# Patient Record
Sex: Female | Born: 1975
Health system: Southern US, Community
[De-identification: ages and names within clinical notes are randomized; demographics above are authoritative.]

## PROBLEM LIST (undated history)

## (undated) DIAGNOSIS — M1990 Unspecified osteoarthritis, unspecified site: Secondary | ICD-10-CM

## (undated) DIAGNOSIS — J45909 Unspecified asthma, uncomplicated: Secondary | ICD-10-CM

## (undated) HISTORY — PX: TONSILLECTOMY: SUR1361

## (undated) HISTORY — PX: WISDOM TOOTH EXTRACTION: SHX21

---

## 1998-03-04 ENCOUNTER — Inpatient Hospital Stay (HOSPITAL_COMMUNITY): Admission: AD | Admit: 1998-03-04 | Discharge: 1998-03-04 | Payer: Self-pay | Admitting: *Deleted

## 1998-06-23 ENCOUNTER — Ambulatory Visit (HOSPITAL_COMMUNITY): Admission: RE | Admit: 1998-06-23 | Discharge: 1998-06-23 | Payer: Self-pay | Admitting: *Deleted

## 1998-09-21 ENCOUNTER — Inpatient Hospital Stay (HOSPITAL_COMMUNITY): Admission: AD | Admit: 1998-09-21 | Discharge: 1998-09-21 | Payer: Self-pay | Admitting: *Deleted

## 1998-09-24 ENCOUNTER — Inpatient Hospital Stay (HOSPITAL_COMMUNITY): Admission: AD | Admit: 1998-09-24 | Discharge: 1998-09-24 | Payer: Self-pay | Admitting: Obstetrics & Gynecology

## 1998-10-24 ENCOUNTER — Inpatient Hospital Stay (HOSPITAL_COMMUNITY): Admission: AD | Admit: 1998-10-24 | Discharge: 1998-10-26 | Payer: Self-pay | Admitting: Obstetrics and Gynecology

## 2002-03-26 ENCOUNTER — Other Ambulatory Visit: Admission: RE | Admit: 2002-03-26 | Discharge: 2002-03-26 | Payer: Self-pay | Admitting: Obstetrics and Gynecology

## 2002-04-22 ENCOUNTER — Ambulatory Visit (HOSPITAL_COMMUNITY): Admission: RE | Admit: 2002-04-22 | Discharge: 2002-04-22 | Payer: Self-pay | Admitting: Obstetrics and Gynecology

## 2003-02-04 ENCOUNTER — Encounter: Payer: Self-pay | Admitting: Emergency Medicine

## 2003-02-04 ENCOUNTER — Emergency Department (HOSPITAL_COMMUNITY): Admission: EM | Admit: 2003-02-04 | Discharge: 2003-02-04 | Payer: Self-pay | Admitting: Emergency Medicine

## 2003-05-21 ENCOUNTER — Other Ambulatory Visit: Admission: RE | Admit: 2003-05-21 | Discharge: 2003-05-21 | Payer: Self-pay | Admitting: Obstetrics and Gynecology

## 2003-11-24 ENCOUNTER — Emergency Department (HOSPITAL_COMMUNITY): Admission: EM | Admit: 2003-11-24 | Discharge: 2003-11-24 | Payer: Self-pay | Admitting: Emergency Medicine

## 2004-03-21 ENCOUNTER — Emergency Department (HOSPITAL_COMMUNITY): Admission: EM | Admit: 2004-03-21 | Discharge: 2004-03-21 | Payer: Self-pay | Admitting: Emergency Medicine

## 2004-11-23 ENCOUNTER — Inpatient Hospital Stay (HOSPITAL_COMMUNITY): Admission: AD | Admit: 2004-11-23 | Discharge: 2004-11-23 | Payer: Self-pay | Admitting: Obstetrics and Gynecology

## 2005-01-25 ENCOUNTER — Emergency Department (HOSPITAL_COMMUNITY): Admission: EM | Admit: 2005-01-25 | Discharge: 2005-01-25 | Payer: Self-pay | Admitting: Emergency Medicine

## 2005-03-24 ENCOUNTER — Emergency Department (HOSPITAL_COMMUNITY): Admission: EM | Admit: 2005-03-24 | Discharge: 2005-03-24 | Payer: Self-pay | Admitting: Emergency Medicine

## 2006-07-26 ENCOUNTER — Other Ambulatory Visit: Admission: RE | Admit: 2006-07-26 | Discharge: 2006-07-26 | Payer: Self-pay | Admitting: Obstetrics and Gynecology

## 2007-08-14 ENCOUNTER — Ambulatory Visit (HOSPITAL_COMMUNITY): Admission: RE | Admit: 2007-08-14 | Discharge: 2007-08-14 | Payer: Self-pay | Admitting: Obstetrics and Gynecology

## 2011-05-08 NOTE — Op Note (Signed)
[name], Carolyn Martinez              ACCOUNT NO.:  [id number]   MEDICAL RECORD NO.:  [id number]          PATIENT TYPE:  AMB   LOCATION:  SDC                           FACILITY:  [facility]   PHYSICIAN:  Sherron Monday, MD        DATE OF BIRTH:  July 04, 1976   DATE OF PROCEDURE:  08/14/2007  DATE OF DISCHARGE:                               OPERATIVE REPORT   PREOPERATIVE DIAGNOSIS:  Pelvic pain, dysmenorrhea.   POSTOPERATIVE DIAGNOSIS:  Pelvic pain, dysmenorrhea.   OPERATION PERFORMED:  Operative laparoscopy, lysis of adhesions.   SURGEON:  Sherron Monday, MD   ASSISTANT:  None.   ANESTHESIA:  General, with 6 mL of local anesthesia.   FINDINGS:  Normal uterus, tubes and bilateral ovaries.  Normal liver  edge.  Appendix not visualized.  Small area on the left side of  peritoneal adhesion.   SPECIMEN:  None.   ESTIMATED BLOOD LOSS:  Minimal.   IV FLUIDS:  1700 mL.   URINE OUTPUT:  In and out catheter at start of procedure.   COMPLICATIONS:  None.   DISPOSITION:  Stable to PACU.   DESCRIPTION OF PROCEDURE:  After informed consent was reviewed with the  patient including risks, benefits and alternatives of surgical  procedure, including bleeding, infection, damage to the surrounding  organs, including but not limited to bowel, bladder, ureter, blood  vessels in the pelvis.  She stated that she had previously had surgery  with lysis of adhesions with similar symptoms and felt much better  following that.  Decision was made to proceed with the surgery.  The  patient was taken to the operating room, placed on the table in supine  position.  General anesthesia was induced and found to be adequate.  She  was then placed in the yellow fin stirrups, prepped and draped in the  normal sterile fashion.  The bladder was steriley drained.  Using a  speculum, a uterine manipulator was placed.  Local anesthesia was placed  at the site of the incision. An approximately 1 cm infraumbilical  incision was  made.  Using Veress needle, the peritoneal cavity was  identified as being entered by the Veress with the hanging drop test.  Pneumoperitoneum was then obtained.  Using direct visualization, a 5 mm  trocar was placed through the umbilicus and the 5 mm camera was used to  visualize the pelvis.  The above findings were noted with a small area  on the left side of filmy thin adhesion between peritoneum and colon.  An accessory port was placed on the left after visualization of the  epigastric vessels.  Prior to the port being placed the area was infused  with local anesthesia. Approximately 1 cm incision was made and a 5 mm  port was placed swith direct visualization.  Using blunt probe, the  normal anatomy was again verified and using incision and Kleppingers to  obtain hemostasis the adhesions were taken down.  The suction irrigator  was used and hemostasis was again assured.  The accessory port was  removed under direct visualization and pneumoperitoneum was evacuated  from her abdomen.  The ports were all removed.  The skin incisions were  closed with 4-0 Vicryl in typical fashion.  Sponge, lap, and needle  counts were correct x 2.  The patient tolerated the procedure well.  The  uterine manipulator was removed.      Sherron Monday, MD  Electronically Signed     JB/MEDQ  D:  08/14/2007  T:  08/15/2007  Job:  [id number]

## 2011-05-08 NOTE — H&P (Signed)
Carolyn Martinez, Carolyn Martinez              ACCOUNT NO.:  [id number]   MEDICAL RECORD NO.:  [id number]          PATIENT TYPE:  AMB   LOCATION:  SDC                           FACILITY:  [facility]   PHYSICIAN:  Sherron Monday, MD        DATE OF BIRTH:  [birth year]   DATE OF ADMISSION:  DATE OF DISCHARGE:                              HISTORY & PHYSICAL   She is being admitted to undergo a diagnostic/possible operative  laparoscope with a diagnosis of pelvic pain.   A 35 year old G2, P1-0-1-1 with continuous pain and cramping throughout  her cycles.  She states that her pain very much increases during her  cycle.  She has had a trial of pills and did not feel that this helped  her so she has stopped them.   PAST MEDICAL HISTORY:  Significant for asthma without hospitalizations.   PAST SURGICAL HISTORY:  Significant for laparoscopy in April 2003 at  which time she definitely knows she had lysis of adhesions and is unsure  about if she had a lipoma removed.   PAST OBSTETRICAL AND GYNECOLOGICAL HISTORY:  She is a G2, P1-0-1-1 with  G1 being a term delivery of a 6-pound 9-ounce infant.  G2 was an  elective abortion.  No abnormal Pap smears or history of sexually  transmitted disease.  She describes her menses as monthly and lasting 7  days.  She uses condoms for birth control.  She has dyspareunia but is  helped by position change.  She is sexually active with just one  partner.  No history of any intermenstrual bleeding or any postcoital  bleeding.   MEDICATIONS:  Albuterol p.r.n.   ALLERGIES:  No known drug allergies.   SOCIAL HISTORY:  Admits to smoking about a half pack a day.  No alcohol  or drug use.  In a serious long-term relationship and works in [employer].   FAMILY HISTORY:  Denies diabetes.  Hypertension in her father.  Endometriosis in her mother and sister.  Coronary artery disease in  maternal grandfather and a paternal aunt.  No breast, colon or ovarian  cancer.   PHYSICAL  EXAMINATION:  Afebrile, vital signs are stable with a weight of  226 pounds.  GENERAL:  No apparent distress.  CARDIOVASCULAR:  Regular rate and rhythm.  LUNGS:  Clear to auscultation bilaterally.  ABDOMEN:  Soft, obese, nontender, nondistended, good bowel sounds.  EXTREMITIES:  Symmetric and nontender.  NECK:  Without lymphadenopathy.  HEENT:  Mucous membranes are moist.  Thyroid within normal limits.  BREASTS:  D cup, no masses, nontender, no to soreness in back and no  costovertebral angle tenderness.  PELVIC:  Normal external female genitalia.  Normal Bartholin's, urethra  and Skene's.  Good support.  Cervix and vagina without lesions.  No  cervical motion tenderness.  Difficult to palpate secondary to her  habitus but apparently normal uterus and adnexa with no masses and  nontender.   On that exam day she had a Pap smear, gonorrhea and chlamydia cultures.  The Pap was within normal limits and the gonorrhea and chlamydia were  negative.  She also had a STD screen that showed that she had been  exposed to HSV type 1.  We recommended weight loss.  She also discussed  with [place] she wants a definitive answer.  We discussed with her risks,  benefits and alternatives of a laparoscopic evaluation of her pelvis  including bleeding, infection, damage to the surrounding organs  including but not limited to bowel, bladder, ureters and blood vessels.  She voices understanding to all this and we will proceed with the  diagnostic, possible operative laparoscope, lysis of adhesions on August 14, 2007.      Sherron Monday, MD  Electronically Signed     JB/MEDQ  D:  08/13/2007  T:  08/14/2007  Job:  [id number]

## 2011-05-11 NOTE — H&P (Signed)
[facility]  Patient:    Carolyn Martinez, Carolyn Martinez Visit Number: [id number] MRN: [id number]          Service Type: Attending:  Alvino Chapel, M.D. Dictated by:   Alvino Chapel, M.D.                           History and Physical  (For surgery on April 22, 2002, at 7:30 a.m.)  HISTORY OF PRESENT ILLNESS:  Patient is a 35 year old, G2, P1-0-1-1, who is admitted for a diagnostic laparoscopy given a problem with recurrent pelvic pain unamenable to Depo-Provera or oral contraceptives.  Patient began to have problems with this pain approximately six to seven months ago and had been treated with a variety of birth control pills as well as Depo-Provera in hopes to alleviate her pain. Although the patient does experience pain daily, she has also noted that the pain is significantly worse with her menses and has required narcotic pain relief for several cycles.  Medically, the patient has no other issues.  PAST SURGICAL HISTORY:  Significant only for tonsillectomy.  PAST OBSTETRICAL HISTORY:  She had a normal spontaneous vaginal delivery, vacuum assisted, and one miscarriage.  PAST GYNECOLOGICAL HISTORY:  There are no abnormal Pap smears, the most recent one in April being normal.  CURRENT MEDICATIONS:  Depo-Provera only.  SOCIAL HISTORY:  Patient is a smoker of approximately half a pack a day.  PHYSICAL EXAMINATION:  VITAL SIGNS:  Patients weight is 221 pounds, blood pressure 120/80, hemoglobin 14.9.  BREASTS:  There are no masses, discharge, or adenopathy noted.  CARDIAC:  Regular rate and rhythm.  LUNGS:  Clear.  ABDOMEN:  Soft and nontender.  PELVIC:  Normal external genitalia.  Cervix has no lesions.  The uterus is normal in size.  There is some nodularity of the uterosacral ligaments palpable.  The adnexa have no palpable masses.  PLAN:  Prior to the procedure, the patient was counseled as to the risks and benefits of proceeding  including bleeding, infection, and possible damage to bowel and bladder with a laparoscopic approach.  The patient understands these risks and also understands the risks to possibly need to proceed with a larger incision should it prove difficult to establish pneumoperitoneum.  Patient is also aware that there could be no pathologies notable; however, we will carefully examine her for any evidence of endometriosis or other pathology. Dictated by:   Alvino Chapel, M.D. Attending:  Alvino Chapel, M.D. DD:  04/20/02 TD:  04/20/02 Job: [phone] [id number]

## 2011-05-11 NOTE — Op Note (Signed)
[facility]  Patient:    Carolyn Martinez, Carolyn Martinez Visit Number: [id number] MRN: [id number]          Service Type: DSU Location: DAY Attending Physician:  Oliver Pila Dictated by:   Huel Cote, M.D. Proc. Date: 04/22/02 Admit Date:  04/22/2002                             Operative Report  PREOPERATIVE DIAGNOSIS:  Pelvic pain.  POSTOPERATIVE DIAGNOSES: 1. Pelvic pain. 2. Minimal adhesions of large bowel to the right sidewall.  PROCEDURE: 1. Diagnostic laparoscopy. 2. Lysis of adhesions.  SURGEON:  Huel Cote, M.D.  ANESTHESIA:  General.  ESTIMATED BLOOD LOSS:  Minimal.  IV FLUIDS:  1200 cc L.R.; urine output 50 cc straight catheterization prior to procedure.  FINDINGS:  There was normal uterus, tubes, and ovaries noted.  There was no evidence of endometriosis, and a small amount of colonic adhesions were noted to the right sidewall and taken down.  DESCRIPTION OF PROCEDURE:  The patient was taken to the operating room where general anesthesia was obtained without difficulty.  She was then prepped and draped in the normal sterile fashion in the dorsal lithotomy position.  A speculum was placed within the vagina and the cervix grasped with a single-tooth tenaculum and the Hulka tenaculum introduced into the cervix. The single-tooth tenaculum and the speculum were then removed from the vagina, and attention was turned to the patients abdomen.  A small infraumbilical incision was made with the scalpel and after 0.25% Marcaine was injected, the Veress needle was then introduced into the incision and intraperitoneal placement confirmed by both injection and aspiration with normal saline.  The camera was introduced and pelvic findings noted.  There were some adhesions of the cecum to the right pelvic sidewall which is where the patient had been experiencing pain.  Therefore, a second 5 mm trocar was introduced into the lower left  quadrant under direct visualization after injection with 0.25% Marcaine.  From this trocar, the adhesion was taken down carefully with both sharp dissection and blunt dissection.  At the conclusion of the procedure, there were no further adhesions noted.  Therefore, the abdomen was inspected and the liver found to be normal.  No other pathology was noted.  The cul-de-sac was carefully inspected and no endometriosis noted.  The ovaries appeared free as did the tubes.  The uterus was slightly globular in nature which could be consistent with adenomyosis.  The 5 mm trocar was then moved under direct visualization with no active bleeding noted.  The pneumoperitoneum reduced through the 10-11 trocar which was placed in the umbilicus and the 10-11 removed under direct visualization.  The umbilical incision was then closed with one deep figure-of-eight suture of 0 Vicryl, and the skin was closed with 3-0 Vicryl in a subcuticular stitch.  The 5 mm trocar site was closed in one mattress suture of 3-0 Vicryl. Dictated by:   Huel Cote, M.D. Attending Physician:  Oliver Pila DD:  04/22/02 TD:  04/22/02 Job: [id number]

## 2011-10-05 LAB — CBC
Hemoglobin: 14.4
MCHC: 34
RDW: 13.3

## 2011-10-05 LAB — HCG, SERUM, QUALITATIVE: Preg, Serum: NEGATIVE

## 2011-11-21 ENCOUNTER — Other Ambulatory Visit: Payer: Self-pay | Admitting: Internal Medicine

## 2011-11-21 DIAGNOSIS — N63 Unspecified lump in unspecified breast: Secondary | ICD-10-CM

## 2011-12-04 ENCOUNTER — Other Ambulatory Visit: Payer: Self-pay

## 2011-12-07 ENCOUNTER — Ambulatory Visit
Admission: RE | Admit: 2011-12-07 | Discharge: 2011-12-07 | Disposition: A | Discharge status: Home / Self Care | Payer: Medicaid Other | Source: Ambulatory Visit | Attending: Internal Medicine | Admitting: Internal Medicine

## 2011-12-07 DIAGNOSIS — N63 Unspecified lump in unspecified breast: Secondary | ICD-10-CM

## 2013-01-01 ENCOUNTER — Ambulatory Visit: Payer: Self-pay | Admitting: Family Medicine

## 2013-01-01 VITALS — BP 138/98 | HR 77 | Temp 98.3°F | Resp 16 | Ht 62.0 in | Wt 242.2 lb

## 2013-01-01 DIAGNOSIS — R300 Dysuria: Secondary | ICD-10-CM

## 2013-01-01 DIAGNOSIS — Z202 Contact with and (suspected) exposure to infections with a predominantly sexual mode of transmission: Secondary | ICD-10-CM

## 2013-01-01 DIAGNOSIS — Z2089 Contact with and (suspected) exposure to other communicable diseases: Secondary | ICD-10-CM

## 2013-01-01 LAB — POCT URINALYSIS DIPSTICK
Bilirubin, UA: NEGATIVE
Glucose, UA: NEGATIVE
Ketones, UA: NEGATIVE
Leukocytes, UA: NEGATIVE
Nitrite, UA: NEGATIVE
pH, UA: 7.5

## 2013-01-01 LAB — POCT WET PREP WITH KOH
Clue Cells Wet Prep HPF POC: 100
KOH Prep POC: POSITIVE
Trichomonas, UA: NEGATIVE

## 2013-01-01 LAB — POCT UA - MICROSCOPIC ONLY
Bacteria, U Microscopic: NEGATIVE
Casts, Ur, LPF, POC: NEGATIVE
Mucus, UA: NEGATIVE

## 2013-01-01 MED ORDER — FLUCONAZOLE 150 MG PO TABS: 150.0000 mg | ORAL_TABLET | Freq: Once | ORAL | Status: DC

## 2013-01-01 MED ORDER — METRONIDAZOLE 500 MG PO TABS: 500.0000 mg | ORAL_TABLET | Freq: Two times a day (BID) | ORAL | Status: DC

## 2013-01-01 MED ORDER — METRONIDAZOLE 500 MG PO TABS: 500.0000 mg | ORAL_TABLET | Freq: Three times a day (TID) | ORAL | Status: DC

## 2013-01-01 NOTE — Patient Instructions (Signed)
Monilial Vaginitis Vaginitis in a soreness, swelling and redness (inflammation) of the vagina and vulva. Monilial vaginitis is not a sexually transmitted infection. CAUSES  Yeast vaginitis is caused by yeast (candida) that is normally found in your vagina. With a yeast infection, the candida has overgrown in number to a point that upsets the chemical balance. SYMPTOMS   White, thick vaginal discharge.  Swelling, itching, redness and irritation of the vagina and possibly the lips of the vagina (vulva).  Burning or painful urination.  Painful intercourse. DIAGNOSIS  Things that may contribute to monilial vaginitis are:  Postmenopausal and virginal states.  Pregnancy.  Infections.  Being tired, sick or stressed, especially if you had monilial vaginitis in the past.  Diabetes. Good control will help lower the chance.  Birth control pills.  Tight fitting garments.  Using bubble bath, feminine sprays, douches or deodorant tampons.  Taking certain medications that kill germs (antibiotics).  Sporadic recurrence can occur if you become ill. TREATMENT  Your caregiver will give you medication.  There are several kinds of anti monilial vaginal creams and suppositories specific for monilial vaginitis. For recurrent yeast infections, use a suppository or cream in the vagina 2 times a week, or as directed.  Anti-monilial or steroid cream for the itching or irritation of the vulva may also be used. Get your caregiver's permission.  Painting the vagina with methylene blue solution may help if the monilial cream does not work.  Eating yogurt may help prevent monilial vaginitis. HOME CARE INSTRUCTIONS   Finish all medication as prescribed.  Do not have sex until treatment is completed or after your caregiver tells you it is okay.  Take warm sitz baths.  Do not douche.  Do not use tampons, especially scented ones.  Wear cotton underwear.  Avoid tight pants and panty  hose.  Tell your sexual partner that you have a yeast infection. They should go to their caregiver if they have symptoms such as mild rash or itching.  Your sexual partner should be treated as well if your infection is difficult to eliminate.  Practice safer sex. Use condoms.  Some vaginal medications cause latex condoms to fail. Vaginal medications that harm condoms are:  Cleocin cream.  Butoconazole (Femstat).  Terconazole (Terazol) vaginal suppository.  Miconazole (Monistat) (may be purchased over the counter). SEEK MEDICAL CARE IF:   You have a temperature by mouth above 102 F (38.9 C).  The infection is getting worse after 2 days of treatment.  The infection is not getting better after 3 days of treatment.  You develop blisters in or around your vagina.  You develop vaginal bleeding, and it is not your menstrual period.  You have pain when you urinate.  You develop intestinal problems.  You have pain with sexual intercourse. Document Released: 09/19/2005 Document Revised: 03/03/2012 Document Reviewed: 06/03/2009 Exit[employer] Patient Information 2013 [employer].  

## 2013-01-01 NOTE — Progress Notes (Signed)
Subjective: 37 year old white female who has not been here for many years. She has been told by her long-term female partner that he is urinary symptoms were caused by Trichomonas and he  has seen a physician.  She is basically healthy person. She quit smoking 6 months ago and has gained weight. She realizes that is of concern. She's not a regular medications for other problems. Last menstrual period was December 15. She does have a discharge and some burning when she urinates. She had herpes many years ago.  She does not have insurance and for her to get excessive testing at this time.  Objective: No acute distress. External genitalia normal. The mucosa unremarkable except for a whitish somewhat bubbly discharge. Bimanual exam normal.  Assessment: Dysuria Trichomonas exposure Obesity  Plan: Discussed with  her need to watch her weight. Commended her on stopping the cigarettes.  Cautioned her that STDs can travel together and she should sometime get complete STD testing done.  Results for orders placed in visit on 01/01/13  POCT WET PREP WITH KOH      Component Value Range   Trichomonas, UA Negative     Clue Cells Wet Prep HPF POC 100%     Epithelial Wet Prep HPF POC 4-10     Yeast Wet Prep HPF POC positive     Bacteria Wet Prep HPF POC 4+     RBC Wet Prep HPF POC 0-1     WBC Wet Prep HPF POC 1-6     KOH Prep POC Positive    POCT URINALYSIS DIPSTICK      Component Value Range   Color, UA yellow     Clarity, UA clear     Glucose, UA neg     Bilirubin, UA neg     Ketones, UA neg     Spec Grav, UA 1.020     Blood, UA neg     pH, UA 7.5     Protein, UA neg     Urobilinogen, UA 0.2     Nitrite, UA neg     Leukocytes, UA Negative    POCT UA - MICROSCOPIC ONLY      Component Value Range   WBC, Ur, HPF, POC 0-1     RBC, urine, microscopic 0-1     Bacteria, U Microscopic neg     Mucus, UA neg     Epithelial cells, urine per micros 0-4     Crystals, Ur, HPF, POC neg     Casts, Ur, LPF, POC neg     Yeast, UA neg     Patient has monilial vaginitis. However even though only the yeast was seen, I'm going to treat her for Trichomonas also since her boyfriend was diagnosed this week with having it and she and him had sex Saturday.

## 2014-01-02 ENCOUNTER — Emergency Department (HOSPITAL_COMMUNITY)
Admission: EM | Admit: 2014-01-02 | Discharge: 2014-01-02 | Disposition: A | Discharge status: Home / Self Care | Payer: Medicaid Other | Attending: Emergency Medicine | Admitting: Emergency Medicine

## 2014-01-02 ENCOUNTER — Encounter (HOSPITAL_COMMUNITY): Payer: Self-pay | Admitting: Emergency Medicine

## 2014-01-02 DIAGNOSIS — R109 Unspecified abdominal pain: Secondary | ICD-10-CM | POA: Insufficient documentation

## 2014-01-02 DIAGNOSIS — B3731 Acute candidiasis of vulva and vagina: Secondary | ICD-10-CM | POA: Insufficient documentation

## 2014-01-02 DIAGNOSIS — F17200 Nicotine dependence, unspecified, uncomplicated: Secondary | ICD-10-CM | POA: Insufficient documentation

## 2014-01-02 DIAGNOSIS — Z3202 Encounter for pregnancy test, result negative: Secondary | ICD-10-CM | POA: Insufficient documentation

## 2014-01-02 DIAGNOSIS — L293 Anogenital pruritus, unspecified: Secondary | ICD-10-CM | POA: Insufficient documentation

## 2014-01-02 DIAGNOSIS — R300 Dysuria: Secondary | ICD-10-CM | POA: Insufficient documentation

## 2014-01-02 DIAGNOSIS — B373 Candidiasis of vulva and vagina: Secondary | ICD-10-CM

## 2014-01-02 LAB — URINALYSIS, ROUTINE W REFLEX MICROSCOPIC
Bilirubin Urine: NEGATIVE
GLUCOSE, UA: NEGATIVE mg/dL
HGB URINE DIPSTICK: NEGATIVE
Ketones, ur: NEGATIVE mg/dL
Nitrite: NEGATIVE
Protein, ur: NEGATIVE mg/dL
SPECIFIC GRAVITY, URINE: 1.027 (ref 1.005–1.030)
UROBILINOGEN UA: 0.2 mg/dL (ref 0.0–1.0)
pH: 5.5 (ref 5.0–8.0)

## 2014-01-02 LAB — URINE MICROSCOPIC-ADD ON

## 2014-01-02 LAB — POCT PREGNANCY, URINE: PREG TEST UR: NEGATIVE

## 2014-01-02 LAB — WET PREP, GENITAL
CLUE CELLS WET PREP: NONE SEEN
Trich, Wet Prep: NONE SEEN

## 2014-01-02 MED ORDER — ONDANSETRON 8 MG PO TBDP
8.0000 mg | ORAL_TABLET | Freq: Once | ORAL | Status: AC
  Administered 2014-01-02: 8 mg via ORAL
  Filled 2014-01-02: qty 1

## 2014-01-02 MED ORDER — AZITHROMYCIN 250 MG PO TABS
1000.0000 mg | ORAL_TABLET | Freq: Once | ORAL | Status: AC
  Administered 2014-01-02: 1000 mg via ORAL
  Filled 2014-01-02: qty 4

## 2014-01-02 MED ORDER — FLUCONAZOLE 150 MG PO TABS
150.0000 mg | ORAL_TABLET | Freq: Once | ORAL | Status: AC
  Administered 2014-01-02: 150 mg via ORAL
  Filled 2014-01-02: qty 1

## 2014-01-02 MED ORDER — CEFTRIAXONE SODIUM 250 MG IJ SOLR
250.0000 mg | Freq: Once | INTRAMUSCULAR | Status: AC
  Administered 2014-01-02: 250 mg via INTRAMUSCULAR
  Filled 2014-01-02: qty 250

## 2014-01-02 NOTE — ED Notes (Signed)
Pt c/o vaginal discharge and lower abdominal pain since yesterday. Pt. sts she had sex with an ex-boyfriend on Monday. Pt c/o burning upon urination. Pt. sts vaginal discharge is a clear brown color and "smells horrible."   Pt c/o swelling to vaginal area and itching. Pt. Denies fevers. A&Ox4. NAD noted.

## 2014-01-02 NOTE — ED Notes (Signed)
Pt. Asked again to give a urine sample. Pt sts "I don't need to go." Pt. Drinking water in the room.

## 2014-01-02 NOTE — ED Notes (Signed)
Pelvic cart completed

## 2014-01-02 NOTE — Discharge Instructions (Signed)
Please read and follow all provided instructions.  Your diagnoses today include:  1. Vaginal candidiasis     Tests performed today include:  Test for gonorrhea and chlamydia. You will be notified by telephone if you have a positive result.  Urine test - no urinary tract infection  Vital signs. See below for your results today.   Medications:  You were treated for chlamydia (1 gram azithromycin pills) and gonorrhea (250mg  rocephin shot). You were also given diflucan for yeast infection.   Home care instructions:  Read educational materials contained in this packet and follow any instructions provided.   You should tell your partners about your infection and avoid having sex for one week to allow time for the medicine to work.  Follow-up instructions: You should follow-up with the [facility] to be tested for HIV, syphilis, and hepatitis -- all of which can be transmitted by sexual contact. We do not routinely screen for these in the Emergency Department.  STD Testing:  [facility] [place] [place] [facility]  [address], [place], phone [phone] or [phone]    Monday - Friday, call for an appointment  [facility] [place] [place] [facility]  [address], [place], phone [phone] or [phone]   Monday - Friday, call for an appointment   If you do not have a primary care doctor -- see below for referral information.   Return instructions:   Please return to the Emergency Department if you experience worsening symptoms.   Please return if you have any other emergent concerns.  Additional Information:  Your vital signs today were: BP 147/84   Pulse 100   Temp(Src) 98.4 F (36.9 C) (Oral)   Resp 16   SpO2 97%   LMP 12/18/2013 If your blood pressure (BP) was elevated above 135/85 this visit, please have this repeated by your doctor within one month. --------------  Emergency  Department Resource Guide 1) Find a Doctor and Pay Out of Pocket Although you won't have to find out who is covered by your insurance plan, it is a good idea to ask around and get recommendations. You will then need to call the office and see if the doctor you have chosen will accept you as a new patient and what types of options they offer for patients who are self-pay. Some doctors offer discounts or will set up payment plans for their patients who do not have insurance, but you will need to ask so you aren't surprised when you get to your appointment.  2) Contact Your Local Health Department Not all health departments have doctors that can see patients for sick visits, but many do, so it is worth a call to see if yours does. If you don't know where your local health department is, you can check in your phone book. The CDC also has a tool to help you locate your state's health department, and many state websites also have listings of all of their local health departments.  3) Find a Walk-in Clinic If your illness is not likely to be very severe or complicated, you may want to try a walk in clinic. These are popping up all over the country in pharmacies, drugstores, and shopping centers. They're usually staffed by nurse practitioners or physician assistants that have been trained to treat common illnesses and complaints. They're usually fairly quick and inexpensive. However, if you have serious medical issues or chronic medical problems, these  are probably not your best option.  No Primary Care Doctor: - Call Health Connect at  [phone] - they can help you locate a primary care doctor that  accepts your insurance, provides certain services, etc. - Physician Referral Service- [phone]  Chronic Pain Problems: Organization         Address  Phone   Notes  [facility]  [phone] Patients need to be referred by their primary care doctor.   Medication  Assistance: Organization         Address  Phone   Notes  [place] [facility] [address] [place], [place] [address] [phone] --Must be a resident of [facility] -- Must have NO insurance coverage whatsoever (no Medicaid/ Medicare, etc.) -- The pt. MUST have a primary care doctor that directs their care regularly and follows them in the community   [facility]  [phone]   [employer]  [phone]    Agencies that provide inexpensive medical care: Organization         Address  Phone   Notes  [place]  [phone]   [facility]    [phone]   [facility] [place], [place] [address] [phone]   [place] [address] [place] [phone]   Planned Parenthood    [phone]   [facility]    [phone]   [place] and [place] [address], [place] Phone:  [phone], Fax:  [phone] Hours of Operation:  9 am - 6 pm, M-F.  Also accepts Medicaid/Medicare and self-pay.  [facility] [place], [address], [place] Phone: [phone], Fax: [phone]. Hours of Operation:  8:30 am - 5:30 pm, M-F.  Also accepts Medicaid and self-pay.  [facility] [place] [address] [place] Phone: [phone]   [place], [place], [place] [phone], Ext. [phone] Mondays & Thursdays: 7-9 AM.  First 15 patients are seen on a first come, first serve basis.    [place] Providers:  Organization         Address  Phone   Notes  [facility] [address], [place] [phone] Also accepts self-pay patients.  [facility] [address], [place]  [phone]   [place], [address], [place]  [phone]   [facility] [address], [place] [phone]   Lucianne Lei [address], [place]   [phone] Only accepts [place] Access [place] patients after they have their name applied to their card.   Self-Pay (no insurance) in [facility]:  Organization         Address  Phone   Notes  Sickle Cell Patients, [facility] [place] [phone]   [facility] [place] [phone]   [facility] [place]  [place], [place],  [phone]   [facility]/Dr. Osei-Bonsu  [address] [place] or [address], [place] [phone] Phone number for both [place] and [place] locations is the same.  [facility] [address] [place] [phone]   [facility] [address], [place] or [address] [phone]   [facility], [place] [address], [place] [phone], phone; [phone], fax Sees patients 1st and 3rd Saturday of every month.  Must not qualify for public or private insurance (i.e. Medicaid, Medicare, [place] Health Choice, Veterans' Benefits)  Household income should be no more than 200% of the poverty level The clinic cannot treat you if you are pregnant or think you are pregnant  Sexually transmitted diseases are not treated at the clinic.    Dental Care: Organization         Address  Phone  Notes  [facility] [place] [phone] Accepts children up to age 17 who are enrolled in [place] or [place]; pregnant women with a Medicaid card; and children who have applied for Medicaid or [place] Health Choice, but were declined, whose parents can pay a reduced fee at time of service.  [facility]  Department of [facility]  [address], [place] [phone] Accepts children up to age 32 who are enrolled in [place] or [place]; pregnant women with a Medicaid card; and children who have applied for Medicaid or [place] Health Choice, but were declined, whose parents can pay a reduced fee at time of service.  [facility] Adult [facility] Access PROGRAM  [place] [phone] Patients are seen by appointment only. Walk-ins are not accepted. [place] will see patients 1 years of age and older. Monday - Tuesday (8am-5pm) Most Wednesdays (8:30-5pm) $30 per visit, cash only  [facility] Adult Dental Access PROGRAM  [address], [facility] [phone] Patients are seen by appointment only. Walk-ins are not accepted. [place] will see patients 45 years of age and older. One Wednesday Evening (Monthly: Volunteer Based).  $30 per visit, cash only  [place]  [phone] for adults; Children under age 59, call Graduate Pediatric Dentistry at [phone]. Children aged 42-14, please call [phone] to request a pediatric application.  Dental services are provided in all areas of dental care including fillings, crowns and bridges, complete and partial dentures, implants, gum treatment, root canals, and extractions. Preventive care is also provided. Treatment is provided to both adults and children. Patients are selected via a lottery and there is often a waiting list.   [facility] [address], [place]  [phone] [email or web address]   [facility] [address] [place], [place] [phone], Ext. [phone] Second and Fourth Thursday of each month, opens at 6:30 AM; Clinic ends at 9 AM.  Patients are seen on a first-come first-served basis, and a limited number are seen during each clinic.   [facility]  [address] [place] Danker [place], [place] [phone]    Eligibility Requirements You must have lived in [place], [place], or [place] counties for at least the last three months.   You cannot be eligible for state or federal sponsored [employer], including [employer], [place], or [employer].   You generally cannot be eligible for healthcare insurance through your employer.    How to apply: Eligibility screenings are held every Tuesday and Wednesday afternoon from 1:00 pm until 4:00 pm. You do not need an appointment for the interview!  [facility] [address] [place], [place]   [place]  [place] Department  [place]  [phone]    Behavioral Health Resources in the Community: Intensive Outpatient Programs Organization         Address  Phone  Notes  [place]. [address], [place], [place] [phone]   [facility] Outpatient [address], [place], [place]   ADS: Alcohol & Drug Svcs [address], [place], [place]   [place] [address]  [place], [place] or [phone]   Substance Abuse Resources Organization         Address  Phone  Notes  Alcohol and Drug Services  [phone]   [place]  [phone]   The [place]   Chinita Pester  [phone]   Residential & Outpatient Substance Abuse Program  [phone]   Psychological Services Organization         Address  Phone  Notes  [facility] [place]  [place]  [phone]   [place] [address], [place] or [phone]    Mobile Crisis Teams Organization         Address  Phone  Notes  [facility], Mobile Crisis Care Unit  [phone]   [facility] Psychotherapeutic [facility]  [address]  [place], [place]   [place] [address] [place] [phone]    Self-Help/Support Groups Organization         Address  Phone             Notes  [place]. of [place] - variety of support groups  [place] Call for more information  Narcotics Anonymous (NA), [facility] [address], [employer]   2 meetings at this location   Special educational needs teacher         Address  Phone  Notes  ASAP Residential Treatment [place],    [place]  [phone]   [facility]  [address] [place] [phone], [place], [place]   [place], [place] [phone] Admissions: 8am-3pm M-F  Incentives Substance [place] [address]    [place], [place] [phone]   [facility] [address] [place], [place], [place]   [facility] [address],  [place], [place]   [facility] - Intensive Outpatient [address], [place], [place]   [place] ([place].,  [place], [place] [phone] or [phone]   [facility]idential Treatment Services (RTS) [address] [place], [place] Accepts Medicaid  [facility] [place] [address],  [place] [phone] Substance Abuse/Addiction Treatment   [facility] Organization         Address  Phone  Notes  [facility]  [phone]   Domenic Schwab, PhD [address]lis [place], [place]   [phone] or [phone]   [place], [place] [phone]   [place] [address] [place], [place] [phone] Insurance/Medicaid/sponsorship through [employer] and Families [address], [place] [address]  [address] [place], [place] [phone] [place], [place] [phone]    Dr. Adele Schilder  [phone]   [facility] Dept. 1) [address] [place] 2) [place] 3)  [place] [address], [place] [phone]  [phone]   [place] [phone] or [phone] (After Hours)

## 2014-01-02 NOTE — ED Provider Notes (Signed)
Medical screening examination/treatment/procedure(s) were performed by non-physician practitioner and as supervising physician I was immediately available for consultation/collaboration.  EKG Interpretation   None        Daune Divirgilio T Leyna Vanderkolk, MD 01/02/14 2340 

## 2014-01-02 NOTE — ED Provider Notes (Signed)
CSN: [id number]     Arrival date & time 01/02/14  1638 History   First MD Initiated Contact with Patient 01/02/14 1656     Chief Complaint  Patient presents with  . Vaginal Discharge  . Vaginal Itching   (Consider location/radiation/quality/duration/timing/severity/associated sxs/prior Treatment) HPI Comments: Patient presents with complaint of brown vaginal discharge and vaginal itching for the past 2 days. Patient states that she has been sexually active with one partner. She is concerned that she could have contact with a sexual transmitted disease. She has had some lower abdominal cramping pain. No fever, nausea, vomiting, or diarrhea. Patient does have dysuria. Onset of symptoms gradual. Course is gradually worsening. Nothing makes symptoms better or worse. No treatments prior to arrival.  Patient is a 38 y.o. female presenting with vaginal discharge and vaginal itching. The history is provided by the patient.  Vaginal Discharge Associated symptoms: vaginal itching   Associated symptoms: no dysuria and no fever   Vaginal Itching Pertinent negatives include no arthralgias, fever, rash or sore throat.    History reviewed. No pertinent past medical history. Past Surgical History  Procedure Laterality Date  . Wisdom tooth extraction    . Tonsillectomy     No family history on file. History  Substance Use Topics  . Smoking status: Current Some Day Smoker  . Smokeless tobacco: Not on file  . Alcohol Use: No   OB History   Grav Para Term Preterm Abortions TAB SAB Ect Mult Living                 Review of Systems  Constitutional: Negative for fever.  HENT: Negative for sore throat.   Eyes: Negative for discharge.  Gastrointestinal: Negative for rectal pain.  Genitourinary: Positive for vaginal discharge and pelvic pain (minor cramping). Negative for dysuria, frequency, vaginal bleeding and genital sores.  Musculoskeletal: Negative for arthralgias.  Skin: Negative for rash.   Hematological: Negative for adenopathy.    Allergies  Review of patient's allergies indicates no known allergies.  Home Medications   Current Outpatient Rx  Name  Route  Sig  Dispense  Refill  . ibuprofen (ADVIL,MOTRIN) 200 MG tablet   Oral   Take 400 mg by mouth every 6 (six) hours as needed.          BP 147/84  Pulse 100  Temp(Src) 98.4 F (36.9 C) (Oral)  Resp 16  SpO2 97%  LMP 12/18/2013 Physical Exam  Nursing note and vitals reviewed. Constitutional: She appears well-developed and well-nourished.  HENT:  Head: Normocephalic and atraumatic.  Eyes: Conjunctivae are normal. Right eye exhibits no discharge. Left eye exhibits no discharge.  Neck: Normal range of motion. Neck supple.  Cardiovascular: Normal rate, regular rhythm and normal heart sounds.   Pulmonary/Chest: Effort normal and breath sounds normal.  Abdominal: Soft. There is no tenderness.  Genitourinary: Uterus normal. There is no rash, tenderness or lesion on the right labia. There is no rash, tenderness or lesion on the left labia. Uterus is not enlarged and not tender. Cervix exhibits no motion tenderness, no discharge and no friability. Right adnexum displays no mass and no tenderness. Left adnexum displays no mass and no tenderness. No tenderness around the vagina. Vaginal discharge (white, mild) found.  Neurological: She is alert.  Skin: Skin is warm and dry.  Psychiatric: She has a normal mood and affect.    ED Course  Procedures (including critical care time) Labs Review Labs Reviewed  WET PREP, GENITAL - Abnormal; Notable for  the following:    Yeast Wet Prep HPF POC MODERATE (*)    WBC, Wet Prep HPF POC FEW (*)    All other components within normal limits  URINALYSIS, ROUTINE W REFLEX MICROSCOPIC - Abnormal; Notable for the following:    APPearance CLOUDY (*)    Leukocytes, UA MODERATE (*)    All other components within normal limits  URINE MICROSCOPIC-ADD ON - Abnormal; Notable for the  following:    Squamous Epithelial / LPF MANY (*)    All other components within normal limits  GC/CHLAMYDIA PROBE AMP  POCT PREGNANCY, URINE   Imaging Review No results found.  EKG Interpretation   None      5:37 PM Patient seen and examined. Work-up initiated. Patient requests treatment for GC/chlamydia. Medications ordered.   Vital signs reviewed and are as follows: Filed Vitals:   01/02/14 1648  BP: 147/84  Pulse: 100  Temp: 98.4 F (36.9 C)  Resp: 16   5:58 PM Pelvic performed by Dala DockFierke PA-S2 under my supervision, nurse chaperone Rogelio Seen([place]).   8:39 PM UA neg. Pt informed of results. She has been treated for yeast infection with PO diflucan.    MDM   1. Vaginal candidiasis    Vaginal d/c: likely 2/2 yeast infection. Treated for GC/chlamydia given recent sexual contact. No UTI. No concern for PID/TOA given her pelvic exam. She appears well, non-toxic.     Renne CriglerJoshua Weylyn Ricciuti, PA-C 01/02/14 2041

## 2014-01-02 NOTE — ED Notes (Signed)
Pt from home reports vaginal brownish d/c, itching x2 days. Pt is A&O and in NAD

## 2014-01-02 NOTE — ED Notes (Signed)
Pt states unable to urinate at this time will attempt to collect in 10 mins

## 2014-01-02 NOTE — ED Notes (Signed)
Pt. Made aware that delay is due to lack of urine sample. Pt given 3 cups of water to drink.

## 2014-01-02 NOTE — ED Notes (Signed)
Pt attempted to urinate and was unable too provide specimen.

## 2014-01-04 LAB — GC/CHLAMYDIA PROBE AMP
CT PROBE, AMP APTIMA: NEGATIVE
GC Probe RNA: NEGATIVE

## 2014-06-22 ENCOUNTER — Emergency Department (HOSPITAL_COMMUNITY)
Admission: EM | Admit: 2014-06-22 | Discharge: 2014-06-22 | Disposition: A | Discharge status: Home / Self Care | Payer: Medicaid Other | Attending: Emergency Medicine | Admitting: Emergency Medicine

## 2014-06-22 ENCOUNTER — Emergency Department (HOSPITAL_COMMUNITY): Payer: Medicaid Other

## 2014-06-22 ENCOUNTER — Encounter (HOSPITAL_COMMUNITY): Payer: Self-pay | Admitting: Emergency Medicine

## 2014-06-22 DIAGNOSIS — Z791 Long term (current) use of non-steroidal anti-inflammatories (NSAID): Secondary | ICD-10-CM | POA: Insufficient documentation

## 2014-06-22 DIAGNOSIS — Y9389 Activity, other specified: Secondary | ICD-10-CM | POA: Insufficient documentation

## 2014-06-22 DIAGNOSIS — W19XXXA Unspecified fall, initial encounter: Secondary | ICD-10-CM

## 2014-06-22 DIAGNOSIS — M1710 Unilateral primary osteoarthritis, unspecified knee: Secondary | ICD-10-CM | POA: Insufficient documentation

## 2014-06-22 DIAGNOSIS — Z79899 Other long term (current) drug therapy: Secondary | ICD-10-CM | POA: Insufficient documentation

## 2014-06-22 DIAGNOSIS — M1711 Unilateral primary osteoarthritis, right knee: Secondary | ICD-10-CM

## 2014-06-22 DIAGNOSIS — F17200 Nicotine dependence, unspecified, uncomplicated: Secondary | ICD-10-CM | POA: Insufficient documentation

## 2014-06-22 DIAGNOSIS — W010XXA Fall on same level from slipping, tripping and stumbling without subsequent striking against object, initial encounter: Secondary | ICD-10-CM | POA: Insufficient documentation

## 2014-06-22 DIAGNOSIS — Y92009 Unspecified place in unspecified non-institutional (private) residence as the place of occurrence of the external cause: Secondary | ICD-10-CM | POA: Insufficient documentation

## 2014-06-22 MED ORDER — TRAMADOL HCL 50 MG PO TABS: 50.0000 mg | ORAL_TABLET | Freq: Four times a day (QID) | ORAL | Status: DC | PRN

## 2014-06-22 MED ORDER — NAPROXEN 500 MG PO TABS: 500.0000 mg | ORAL_TABLET | Freq: Two times a day (BID) | ORAL | Status: DC

## 2014-06-22 NOTE — ED Provider Notes (Signed)
CSN: [id number]     Arrival date & time 06/22/14  2009 History  This chart was scribed for non-physician practitioner working, Earley FavorGail Schulz, NP, with Linwood DibblesJon Knapp, MD, by Bronson CurbJacqueline Melvin, ED Scribe. This patient was seen in room WTR9/WTR9 and the patient's care was started at 8:29 PM.    Chief Complaint  Patient presents with  . Knee Pain     Patient is a 38 y.o. female presenting with knee pain. The history is provided by the patient. No language interpreter was used.  Knee Pain   HPI Comments: Ala Dachiffany N Lenzen is a 38 y.o. female who presents to the Emergency Department complaining of gradually worsening, bilateral knee pain that began 3 weeks ago. Patient states she fell onto her knees after her shoe became caught on a step. She also reports a second fall that occurred tonight when she tripped over a pair of shoes. She reports the pain has gotten worse since the second fall. There is associated swelling to the right knee. She has applied ice and taken OTC pain relievers with no improvement. Patient denies any other injuries.   History reviewed. No pertinent past medical history. Past Surgical History  Procedure Laterality Date  . Wisdom tooth extraction    . Tonsillectomy     No family history on file. History  Substance Use Topics  . Smoking status: Current Every Day Smoker -- 0.25 packs/day for 15 years    Types: Cigarettes  . Smokeless tobacco: Not on file  . Alcohol Use: No   OB History   Grav Para Term Preterm Abortions TAB SAB Ect Mult Living                 Review of Systems  Musculoskeletal: Positive for arthralgias (bilateral knee pain).  All other systems reviewed and are negative.     Allergies  Review of patient's allergies indicates no known allergies.  Home Medications   Prior to Admission medications   Medication Sig Start Date End Date Taking? Authorizing Provider  ibuprofen (ADVIL,MOTRIN) 200 MG tablet Take 400 mg by mouth every 6 (six) hours as  needed.   Yes Historical Provider, MD  loratadine (CLARITIN) 10 MG tablet Take 10 mg by mouth daily.   Yes Historical Provider, MD  vitamin B-12 (CYANOCOBALAMIN) 1000 MCG tablet Take 1,000 mcg by mouth daily.   Yes Historical Provider, MD  naproxen (NAPROSYN) 500 MG tablet Take 1 tablet (500 mg total) by mouth 2 (two) times daily with a meal. 06/22/14   Arman FilterGail K Schulz, NP  traMADol (ULTRAM) 50 MG tablet Take 1 tablet (50 mg total) by mouth every 6 (six) hours as needed. 06/22/14   Arman FilterGail K Schulz, NP   Triage Vitals: BP 137/88  Pulse 96  Temp(Src) 98.2 F (36.8 C) (Oral)  Resp 18  SpO2 96%  LMP 06/13/2014  Physical Exam  Nursing note and vitals reviewed. Constitutional: She is oriented to person, place, and time. She appears well-developed and well-nourished. No distress.  HENT:  Head: Normocephalic and atraumatic.  Eyes: EOM are normal. Pupils are equal, round, and reactive to light.  Neck: Normal range of motion.  Cardiovascular: Normal rate and regular rhythm.   Pulmonary/Chest: Effort normal and breath sounds normal.  Musculoskeletal: Normal range of motion. She exhibits edema and tenderness.       Right knee: She exhibits swelling and bony tenderness. She exhibits normal range of motion, no effusion, no ecchymosis, normal alignment, no LCL laxity and normal patellar mobility. Tenderness  found. Patellar tendon tenderness noted.       Left knee: She exhibits normal range of motion, no swelling, no effusion, no ecchymosis, no erythema and normal alignment. Tenderness found. Patellar tendon tenderness noted.  Tenderness over tibial tuberosity and patella over the right knee. FROM bilaterally. Reduced strength in the right knee.  Neurological: She is alert and oriented to person, place, and time.  Skin: Skin is warm and dry. No erythema.  Psychiatric: She has a normal mood and affect. Her behavior is normal.    ED Course  Procedures (including critical care time)  DIAGNOSTIC  STUDIES: Oxygen Saturation is 96% on room air, adeqaute by my interpretation.    COORDINATION OF CARE: At 2040 Discussed treatment plan with patient which includes imaging. Patient agrees.   Labs Review Labs Reviewed - No data to display  Imaging Review Dg Knee Complete 4 Views Right  06/22/2014   CLINICAL DATA:  Fall onto right knee 3 weeks ago.  Pain and swelling  EXAM: RIGHT KNEE - COMPLETE 4+ VIEW  COMPARISON:  None.  FINDINGS: There is a large suprapatellar joint effusion. Mild tricompartment osteoarthritis is noted. No fracture identified. No radiopaque foreign bodies are soft tissue calcifications.  IMPRESSION: 1. Joint effusion. 2. Osteoarthritis.   Electronically Signed   By: Signa Kellaylor  Stroud M.D.   On: 06/22/2014 21:12     EKG Interpretation None      MDM   Final diagnoses:  Primary osteoarthritis of right knee  Fall as cause of accidental injury in home as place of occurrence      I personally performed the services described in this documentation, which was scribed in my presence. The recorded information has been reviewed and is accurate.      Arman FilterGail K Schulz, NP 06/22/14 2124

## 2014-06-22 NOTE — ED Notes (Signed)
Patient states she fell three weeks ago, landing on her knees. Patient states she has been using ice, ibuprofen, and elevating her knees at home. Patient states she fell again tonight after tripping. Patient states nothing is new with tonights injury, states she is unable to tolerate the pain any longer. Patient was ambulatory to triage. Swelling noted to R knee. Denies any prior injuries to her knees.

## 2014-06-22 NOTE — ED Notes (Signed)
Pt reports tripping a few weeks ago, which she states her shoe got caught on a step and caused her to fall onto her knees. Pt states she has had intermittent pain in the bilateral knees with mild swelling. Pt states she tripped again tonight on a pair of shoes that was sitting on the floor while doing laundry. Pt states the bilateral knee pain worsened after the fall tonight.  Pt is A/O x4, in NAD, ambulatory to triage without difficulty, and vitals are WDL.

## 2014-06-23 NOTE — ED Provider Notes (Signed)
Medical screening examination/treatment/procedure(s) were performed by non-physician practitioner and as supervising physician I was immediately available for consultation/collaboration.    Jon Knapp, MD 06/23/14 0006 

## 2016-02-05 ENCOUNTER — Emergency Department (HOSPITAL_COMMUNITY): Payer: Medicaid Other

## 2016-02-05 ENCOUNTER — Emergency Department (HOSPITAL_COMMUNITY)
Admission: EM | Admit: 2016-02-05 | Discharge: 2016-02-05 | Disposition: A | Discharge status: Home / Self Care | Payer: Medicaid Other | Attending: Emergency Medicine | Admitting: Emergency Medicine

## 2016-02-05 ENCOUNTER — Encounter (HOSPITAL_COMMUNITY): Payer: Self-pay

## 2016-02-05 DIAGNOSIS — G8929 Other chronic pain: Secondary | ICD-10-CM | POA: Insufficient documentation

## 2016-02-05 DIAGNOSIS — E669 Obesity, unspecified: Secondary | ICD-10-CM | POA: Insufficient documentation

## 2016-02-05 DIAGNOSIS — F17210 Nicotine dependence, cigarettes, uncomplicated: Secondary | ICD-10-CM | POA: Insufficient documentation

## 2016-02-05 DIAGNOSIS — M25561 Pain in right knee: Secondary | ICD-10-CM | POA: Insufficient documentation

## 2016-02-05 DIAGNOSIS — R2243 Localized swelling, mass and lump, lower limb, bilateral: Secondary | ICD-10-CM | POA: Insufficient documentation

## 2016-02-05 DIAGNOSIS — M1990 Unspecified osteoarthritis, unspecified site: Secondary | ICD-10-CM | POA: Insufficient documentation

## 2016-02-05 DIAGNOSIS — M25562 Pain in left knee: Secondary | ICD-10-CM

## 2016-02-05 HISTORY — DX: Unspecified osteoarthritis, unspecified site: M19.90

## 2016-02-05 MED ORDER — IBUPROFEN 800 MG PO TABS
800.0000 mg | ORAL_TABLET | Freq: Once | ORAL | Status: AC
  Administered 2016-02-05: 800 mg via ORAL
  Filled 2016-02-05: qty 1

## 2016-02-05 MED ORDER — ALBUTEROL SULFATE HFA 108 (90 BASE) MCG/ACT IN AERS: 1.0000 | INHALATION_SPRAY | Freq: Four times a day (QID) | RESPIRATORY_TRACT | Status: DC | PRN

## 2016-02-05 MED ORDER — HYDROCODONE-ACETAMINOPHEN 5-325 MG PO TABS
1.0000 | ORAL_TABLET | ORAL | Status: DC | PRN
Start: 2016-02-05 — End: 2017-03-03

## 2016-02-05 MED ORDER — ACETAMINOPHEN 325 MG PO TABS
650.0000 mg | ORAL_TABLET | Freq: Once | ORAL | Status: AC
  Administered 2016-02-05: 650 mg via ORAL
  Filled 2016-02-05: qty 2

## 2016-02-05 NOTE — ED Notes (Signed)
Pt with chronic leg pain .  Pt states told she has arthritis.  Pain has increased over last 2 days.  Pt taking ibuprofen for pain.  Pt is able to ambulate but painful.  No new injury

## 2016-02-05 NOTE — Discharge Instructions (Signed)

## 2016-02-05 NOTE — ED Provider Notes (Signed)
History  By signing my name below, I, Karle Plumber, attest that this documentation has been prepared under the direction and in the presence of Cheri Fowler, PA-C. Electronically Signed: Karle Plumber, ED Scribe. 02/05/2016. 4:29 PM.  Chief Complaint  Patient presents with  . Knee Pain   The history is provided by the patient and medical records. No language interpreter was used.    HPI Comments:  Carolyn Martinez is a 40 y.o. obese female with PMHx of arthritis who presents to the Emergency Department complaining of bilateral knee pain, left greater than right that began worsening two days ago. She reports swelling of bilateral knees, left greater than right. She reports being diagnosed with arthritis about two years ago and does not have insurance so has not been able to follow up with anyone. Pt states she takes Ibuprofen daily, last dose earlier this morning, with minimal relief of the pain. Moving the knees or bearing weight increases her pain. She denies alleviating factors. She denies fever, chills, redness or warmth of the knees, numbness, tingling or weakness of bilateral legs, or swelling of other parts of lower extremities. She denies any trauma, injury or fall.   Past Medical History  Diagnosis Date  . Arthritis    Past Surgical History  Procedure Laterality Date  . Wisdom tooth extraction    . Tonsillectomy     History reviewed. No pertinent family history. Social History  Substance Use Topics  . Smoking status: Current Every Day Smoker -- 0.25 packs/day for 15 years    Types: Cigarettes  . Smokeless tobacco: None  . Alcohol Use: No   OB History    No data available     Review of Systems A complete 10 system review of systems was obtained and all systems are negative except as noted in the HPI and PMH.   Allergies  Review of patient's allergies indicates no known allergies.  Home Medications   Prior to Admission medications   Medication Sig Start Date  End Date Taking? Authorizing Provider  ibuprofen (ADVIL,MOTRIN) 200 MG tablet Take 400 mg by mouth every 6 (six) hours as needed for headache or moderate pain.    Yes Historical Provider, MD   Triage Vitals: BP 131/78 mmHg  Pulse 89  Temp(Src) 98.7 F (37.1 C) (Oral)  Resp 18  SpO2 98%  LMP 02/05/2016 Physical Exam  Constitutional: She is oriented to person, place, and time. She appears well-developed and well-nourished.  Obese female  HENT:  Head: Normocephalic and atraumatic.  Right Ear: External ear normal.  Left Ear: External ear normal.  Eyes: Conjunctivae are normal. No scleral icterus.  Neck: No tracheal deviation present.  Cardiovascular: Intact distal pulses.   Capillary refill less than 3 seconds. No lower extremity pitting edema.  Pulmonary/Chest: Effort normal and breath sounds normal. No respiratory distress.  Abdominal: She exhibits no distension.  Musculoskeletal: She exhibits tenderness.  Left knee: No obvious deformity, ecchymosis, or abrasion.  No effusion or edema. Mildly TTP over lateral patella.  No TTP of distal femur, tibial plateau, or joint line.  No medial or lateral joint tenderness.  No quadricepts tendon tenderness.  Patella stable, normal patella mobility.  Compartment is soft and compressible.  Decreased ROM (flexion/extesion) secondary to pain.  -Negative anterior/posterior drawer.   -Negative varus/valgus test.     Neurological: She is alert and oriented to person, place, and time.  5/5 strength throughout.  Sensation intact.  Skin: Skin is warm and dry.  No erythema,  warmth, swelling.  Psychiatric: She has a normal mood and affect. Her behavior is normal.    ED Course  Procedures (including critical care time) DIAGNOSTIC STUDIES: Oxygen Saturation is 98% on RA, normal by my interpretation.   COORDINATION OF CARE: 3:47 PM- Will X-Ray bilateral knees and order pain medication prior to imaging. Pt verbalizes understanding and agrees to  plan.  Medications  ibuprofen (ADVIL,MOTRIN) tablet 800 mg (800 mg Oral Given 02/05/16 1621)  acetaminophen (TYLENOL) tablet 650 mg (650 mg Oral Given 02/05/16 1620)   Imaging Review Dg Knee Complete 4 Views Left  02/05/2016  CLINICAL DATA:  Chronic bilateral knee pain with increase in the left knee over the past few days EXAM: LEFT KNEE - COMPLETE 4+ VIEW COMPARISON:  None. FINDINGS: No acute fracture or dislocation is noted. Degenerative changes are again seen predominantly within the medial joint space. No acute soft tissue abnormality is noted. IMPRESSION: Degenerative change without acute abnormality. Electronically Signed   By: Alcide Clever M.D.   On: 02/05/2016 16:22   I have personally reviewed and evaluated these images and lab results as part of my medical decision-making.   MDM   Final diagnoses:  Left knee pain    Patient presents with chronic bilateral knee pain with left greater than right. Denies injury or trauma. VSS, NAD. No erythema, warmth, swelling. Left knee minimally tender to palpation. Decreased range of motion secondary to pain. Plain films of the left knee showed degenerative change without acute abnormality. I suspect this is related to degenerative changes in the knee. Doubt arterial or venous thrombus. Doubt septic arthritis. Plan to discharge home with short course of norco. Follow-up with community health and wellness. Discussed return precautions. Patient agrees an Field seismologist the above plan for discharge.  I personally performed the services described in this documentation, which was scribed in my presence. The recorded information has been reviewed and is accurate.    Cheri Fowler, PA-C 02/05/16 1649  Lorre Nick, MD 02/05/16 [phone]

## 2017-03-03 ENCOUNTER — Encounter (HOSPITAL_COMMUNITY): Payer: Self-pay | Admitting: Emergency Medicine

## 2017-03-03 ENCOUNTER — Emergency Department (HOSPITAL_COMMUNITY): Payer: 59

## 2017-03-03 ENCOUNTER — Emergency Department (HOSPITAL_COMMUNITY)
Admission: EM | Admit: 2017-03-03 | Discharge: 2017-03-04 | Disposition: A | Discharge status: Home / Self Care | Payer: 59 | Attending: Emergency Medicine | Admitting: Emergency Medicine

## 2017-03-03 DIAGNOSIS — F17210 Nicotine dependence, cigarettes, uncomplicated: Secondary | ICD-10-CM | POA: Insufficient documentation

## 2017-03-03 DIAGNOSIS — R0602 Shortness of breath: Secondary | ICD-10-CM | POA: Diagnosis present

## 2017-03-03 DIAGNOSIS — J189 Pneumonia, unspecified organism: Secondary | ICD-10-CM | POA: Insufficient documentation

## 2017-03-03 DIAGNOSIS — J45909 Unspecified asthma, uncomplicated: Secondary | ICD-10-CM | POA: Diagnosis not present

## 2017-03-03 DIAGNOSIS — Z79899 Other long term (current) drug therapy: Secondary | ICD-10-CM | POA: Insufficient documentation

## 2017-03-03 HISTORY — DX: Unspecified asthma, uncomplicated: J45.909

## 2017-03-03 LAB — BASIC METABOLIC PANEL
ANION GAP: 8 (ref 5–15)
BUN: 12 mg/dL (ref 6–20)
CHLORIDE: 107 mmol/L (ref 101–111)
CO2: 25 mmol/L (ref 22–32)
Calcium: 9.1 mg/dL (ref 8.9–10.3)
Creatinine, Ser: 0.67 mg/dL (ref 0.44–1.00)
GFR calc Af Amer: >60 mL/min (ref >60)
Glucose, Bld: 122 mg/dL — ABNORMAL HIGH (ref 65–99)
POTASSIUM: 3.3 mmol/L — AB (ref 3.5–5.1)
SODIUM: 140 mmol/L (ref 135–145)

## 2017-03-03 LAB — CBC WITH DIFFERENTIAL/PLATELET
BASOS ABS: 0 10*3/uL (ref 0.0–0.1)
Basophils Relative: 0 %
EOS ABS: 0.2 10*3/uL (ref 0.0–0.7)
Eosinophils Relative: 1 %
HCT: 41 % (ref 36.0–46.0)
HEMOGLOBIN: 13.9 g/dL (ref 12.0–15.0)
LYMPHS ABS: 1.8 10*3/uL (ref 0.7–4.0)
LYMPHS PCT: 12 %
MCH: 30.5 pg (ref 26.0–34.0)
MCHC: 33.9 g/dL (ref 30.0–36.0)
MCV: 89.9 fL (ref 78.0–100.0)
Monocytes Absolute: 1.1 10*3/uL — ABNORMAL HIGH (ref 0.1–1.0)
Monocytes Relative: 8 %
NEUTROS PCT: 79 %
Neutro Abs: 11.5 10*3/uL — ABNORMAL HIGH (ref 1.7–7.7)
PLATELETS: 213 10*3/uL (ref 150–400)
RBC: 4.56 MIL/uL (ref 3.87–5.11)
RDW: 13.6 % (ref 11.5–15.5)
WBC: 14.6 10*3/uL — AB (ref 4.0–10.5)

## 2017-03-03 LAB — TROPONIN I

## 2017-03-03 MED ORDER — IOPAMIDOL (ISOVUE-370) INJECTION 76%
100.0000 mL | Freq: Once | INTRAVENOUS | Status: AC | PRN
  Administered 2017-03-03: 100 mL via INTRAVENOUS

## 2017-03-03 MED ORDER — IPRATROPIUM-ALBUTEROL 0.5-2.5 (3) MG/3ML IN SOLN
3.0000 mL | RESPIRATORY_TRACT | Status: DC
  Administered 2017-03-03: 3 mL via RESPIRATORY_TRACT
  Filled 2017-03-03: qty 3

## 2017-03-03 MED ORDER — DEXTROSE 5 % IV SOLN
500.0000 mg | Freq: Once | INTRAVENOUS | Status: AC
  Administered 2017-03-03: 500 mg via INTRAVENOUS
  Filled 2017-03-03: qty 500

## 2017-03-03 MED ORDER — IOPAMIDOL (ISOVUE-370) INJECTION 76%
INTRAVENOUS | Status: AC
  Administered 2017-03-03: 22:00:00
  Filled 2017-03-03: qty 100

## 2017-03-03 MED ORDER — PREDNISONE 20 MG PO TABS: ORAL_TABLET | ORAL | 0 refills | Status: DC

## 2017-03-03 MED ORDER — ALBUTEROL SULFATE HFA 108 (90 BASE) MCG/ACT IN AERS
1.0000 | INHALATION_SPRAY | Freq: Once | RESPIRATORY_TRACT | Status: AC
  Administered 2017-03-03: 1 via RESPIRATORY_TRACT
  Filled 2017-03-03: qty 6.7

## 2017-03-03 MED ORDER — ALBUTEROL (5 MG/ML) CONTINUOUS INHALATION SOLN
10.0000 mg/h | INHALATION_SOLUTION | RESPIRATORY_TRACT | Status: DC
  Administered 2017-03-03: 10 mg/h via RESPIRATORY_TRACT
  Filled 2017-03-03: qty 20

## 2017-03-03 MED ORDER — HYDROCODONE-HOMATROPINE 5-1.5 MG/5ML PO SYRP: 5.0000 mL | ORAL_SOLUTION | Freq: Four times a day (QID) | ORAL | 0 refills | Status: DC | PRN

## 2017-03-03 MED ORDER — IBUPROFEN 400 MG PO TABS: 400.0000 mg | ORAL_TABLET | Freq: Four times a day (QID) | ORAL | 0 refills | Status: DC | PRN

## 2017-03-03 MED ORDER — IPRATROPIUM BROMIDE 0.02 % IN SOLN
1.0000 mg | Freq: Once | RESPIRATORY_TRACT | Status: AC
  Administered 2017-03-03: 1 mg via RESPIRATORY_TRACT
  Filled 2017-03-03: qty 5

## 2017-03-03 MED ORDER — AZITHROMYCIN 250 MG PO TABS
250.0000 mg | ORAL_TABLET | Freq: Every day | ORAL | 0 refills | Status: DC
Start: 2017-03-03 — End: 2017-11-17

## 2017-03-03 MED ORDER — HYDROCODONE-HOMATROPINE 5-1.5 MG/5ML PO SYRP
5.0000 mL | ORAL_SOLUTION | Freq: Once | ORAL | Status: AC
  Administered 2017-03-03: 5 mL via ORAL
  Filled 2017-03-03: qty 5

## 2017-03-03 MED ORDER — PREDNISONE 20 MG PO TABS
40.0000 mg | ORAL_TABLET | Freq: Once | ORAL | Status: AC
  Administered 2017-03-03: 40 mg via ORAL
  Filled 2017-03-03: qty 2

## 2017-03-03 MED ORDER — MAGNESIUM SULFATE 2 GM/50ML IV SOLN
2.0000 g | Freq: Once | INTRAVENOUS | Status: AC
  Administered 2017-03-03: 2 g via INTRAVENOUS
  Filled 2017-03-03: qty 50

## 2017-03-03 NOTE — ED Notes (Signed)
Pt placed on 2L [place] for comfort.

## 2017-03-03 NOTE — ED Triage Notes (Signed)
Pt reports sore throat starting 3 days ago, progressing into cough and now having shortness of breath. Pt more comfortable sitting up, breathing rapidly. Lung sounds diminished.

## 2017-03-03 NOTE — ED Provider Notes (Signed)
[facility]-EMERGENCY DEPT Provider Note   CSN: [id number] Arrival date & time: 03/03/17  1944     History   Chief Complaint Chief Complaint  Patient presents with  . Shortness of Breath    HPI Carolyn Martinez is a 41 y.o. female.   Shortness of Breath  This is a new problem. The average episode lasts 3 days. The problem occurs continuously.The problem has been gradually worsening. Associated symptoms include a fever, cough and sputum production. Pertinent negatives include no PND, no orthopnea and no abdominal pain. She has tried nothing for the symptoms. The treatment provided no relief. Associated medical issues include asthma.    Past Medical History:  Diagnosis Date  . Arthritis   . Asthma     There are no active problems to display for this patient.   Past Surgical History:  Procedure Laterality Date  . TONSILLECTOMY    . WISDOM TOOTH EXTRACTION      OB History    No data available       Home Medications    Prior to Admission medications   Medication Sig Start Date End Date Taking? Authorizing Provider  guaiFENesin (MUCINEX CHEST CONGESTION CHILD) 100 MG/5ML liquid Take 400 mg by mouth 3 (three) times daily as needed for cough.   Yes Historical Provider, MD  Phenylephrine-Pheniramine-DM [facility] COLD & COUGH PO) Take 1 tablet by mouth daily as needed (cough).   Yes Historical Provider, MD  albuterol (PROVENTIL HFA;VENTOLIN HFA) 108 (90 Base) MCG/ACT inhaler Inhale 1-2 puffs into the lungs every 6 (six) hours as needed for wheezing or shortness of breath. 02/05/16   Cheri Fowler, PA-C  azithromycin (ZITHROMAX) 250 MG tablet Take 1 tablet (250 mg total) by mouth daily. Take 1 every day until finished. 03/03/17   Marily Memos, MD  HYDROcodone-homatropine [facility]) 5-1.5 MG/5ML syrup Take 5 mLs by mouth every 6 (six) hours as needed for cough. 03/03/17   Marily Memos, MD  ibuprofen (ADVIL,MOTRIN) 400 MG tablet Take 1 tablet (400 mg total) by mouth every 6 (six) hours as  needed for fever or moderate pain. 03/03/17   Marily Memos, MD  predniSONE (DELTASONE) 20 MG tablet 2 tabs po daily x 4 days 03/03/17   Marily Memos, MD    Family History No family history on file.  Social History Social History  Substance Use Topics  . Smoking status: Current Every Day Smoker    Packs/day: 0.25    Years: 15.00    Types: Cigarettes  . Smokeless tobacco: Not on file  . Alcohol use No     Allergies   Patient has no known allergies.   Review of Systems Review of Systems  Constitutional: Positive for fever.  Respiratory: Positive for cough, sputum production and shortness of breath.   Cardiovascular: Negative for orthopnea and PND.  Gastrointestinal: Negative for abdominal pain.  All other systems reviewed and are negative.    Physical Exam Updated Vital Signs BP 145/82 (BP Location: Right Arm)   Pulse 110   Temp 100 F (37.8 C) (Oral)   Resp 16   Ht 5\' 3"  (1.6 m)   Wt 215 lb (97.5 kg)   SpO2 93%   BMI 38.09 kg/m   Physical Exam  Constitutional: She is oriented to person, place, and time. She appears well-developed and well-nourished.  HENT:  Head: Normocephalic and atraumatic.  Eyes: Conjunctivae and EOM are normal.  Neck: Normal range of motion.  Cardiovascular: Normal rate and regular rhythm.   Pulmonary/Chest: No  stridor. No respiratory distress. She has wheezes.  Abdominal: Soft. She exhibits no distension.  Neurological: She is alert and oriented to person, place, and time. No cranial nerve deficit. Coordination normal.  Skin: Skin is warm and dry.  Nursing note and vitals reviewed.    ED Treatments / Results  Labs (all labs ordered are listed, but only abnormal results are displayed) Labs Reviewed  CBC WITH DIFFERENTIAL/PLATELET - Abnormal; Notable for the following:       Result Value   WBC 14.6 (*)    Neutro Abs 11.5 (*)    Monocytes Absolute 1.1 (*)    All other components within normal limits  BASIC METABOLIC PANEL -  Abnormal; Notable for the following:    Potassium 3.3 (*)    Glucose, Bld 122 (*)    All other components within normal limits  TROPONIN I    EKG  EKG Interpretation None       Radiology Ct Angio Chest Aorta W &/or Wo Contrast  Result Date: 03/03/2017 CLINICAL DATA:  Increasing shortness of breath for 2 days. Concern for dissection and for preliminary embolus. Sore throat 3 days ago with cough and now shortness of breath. EXAM: CT ANGIOGRAPHY CHEST WITH CONTRAST TECHNIQUE: Multidetector CT imaging of the chest was performed using the standard protocol during bolus administration of intravenous contrast. Multiplanar CT image reconstructions and MIPs were obtained to evaluate the vascular anatomy. CONTRAST:  100 mL Isovue 370 COMPARISON:  None. FINDINGS: Cardiovascular: Noncontrast images of the chest demonstrate no significant aortic atherosclerosis. No evidence of intramural hematoma. Images obtained during the early arterial phase after administration of intravenous contrast material demonstrate moderately good opacification of the central pulmonary arteries without evidence of significant pulmonary embolus. There is moderately good opacification of the thoracic aorta. No direct evidence of aortic dissection. No aortic aneurysm. Great vessel origins are patent. Normal heart size. No pericardial effusion. Mediastinum/Nodes: No enlarged mediastinal, hilar, or axillary lymph nodes. Thyroid gland, trachea, and esophagus demonstrate no significant findings. Lungs/Pleura: Evaluation of the lungs is significantly limited due to motion artifact. There are patchy changes of nodular airspace disease demonstrated throughout both lungs in a predominantly central distribution. This is nonspecific but likely to represent multifocal pneumonia. Other possibilities would include septic emboli, atypical fungal infection or TB, atypical appearance of early edema, or possibly neoplastic process. No pleural effusions.  No pneumothorax. Airways are patent. Upper Abdomen: No acute abnormality. Musculoskeletal: Mild degenerative changes in the thoracic spine. Review of the MIP images confirms the above findings. IMPRESSION: No evidence of pulmonary embolus or area dissection. Diffuse patchy nodular airspace infiltration throughout both lungs is likely due to multifocal pneumonia but the appearance is atypical. Suggest follow-up in 3 months after appropriate clinical treatment to confirm resolution. Electronically Signed   By: Burman NievesWilliam  Stevens M.D.   On: 03/03/2017 22:08    Procedures Procedures (including critical care time)  Medications Ordered in ED Medications  albuterol (PROVENTIL,VENTOLIN) solution continuous neb (0 mg/hr Nebulization Stopped 03/03/17 2208)  ipratropium-albuterol (DUONEB) 0.5-2.5 (3) MG/3ML nebulizer solution 3 mL (3 mLs Nebulization Given 03/03/17 2334)  ipratropium (ATROVENT) nebulizer solution 1 mg (1 mg Nebulization Given 03/03/17 2041)  predniSONE (DELTASONE) tablet 40 mg (40 mg Oral Given 03/03/17 2039)  magnesium sulfate IVPB 2 g 50 mL (0 g Intravenous Stopped 03/03/17 2149)  azithromycin (ZITHROMAX) 500 mg in dextrose 5 % 250 mL IVPB (0 mg Intravenous Stopped 03/03/17 2308)  iopamidol (ISOVUE-370) 76 % injection 100 mL (100 mLs Intravenous Contrast Given  03/03/17 2143)  iopamidol (ISOVUE-370) 76 % injection (  Contrast Given 03/03/17 2209)  HYDROcodone-homatropine (HYCODAN) 5-1.5 MG/5ML syrup 5 mL (5 mLs Oral Given 03/03/17 2334)  albuterol (PROVENTIL HFA;VENTOLIN HFA) 108 (90 Base) MCG/ACT inhaler 1 puff (1 puff Inhalation Given 03/03/17 2344)     Initial Impression / Assessment and Plan / ED Course  I have reviewed the triage vital signs and the nursing notes.  Pertinent labs & imaging results that were available during my care of the patient were reviewed by me and considered in my medical decision making (see chart for details).     Atypical pneumonia. No PE. Improved vitals  compared to arrival. Still having some increased work of breathing and slight tachycardia however at this time I think the tachycardia is probably related to the amount of albuterol she's had. I think that her breathing is very much improved and patient is comfortable without significant tachypnea. Shared decision-making with the patient and she decided she wanted to trial at home therapy for now. I offered admission secondary to her pneumonia and coughing and shortness of breath however she declined and wanted to go home. She lives close to the hospital and can make it back easily, the patient was started on antibiotics and continue her steroids and breathing treatments at home. She will return here if any worsening symptoms or no improvement in 1-2 days.  Final Clinical Impressions(s) / ED Diagnoses   Final diagnoses:  Atypical pneumonia    New Prescriptions New Prescriptions   AZITHROMYCIN (ZITHROMAX) 250 MG TABLET    Take 1 tablet (250 mg total) by mouth daily. Take 1 every day until finished.   HYDROCODONE-HOMATROPINE (HYCODAN) 5-1.5 MG/5ML SYRUP    Take 5 mLs by mouth every 6 (six) hours as needed for cough.   IBUPROFEN (ADVIL,MOTRIN) 400 MG TABLET    Take 1 tablet (400 mg total) by mouth every 6 (six) hours as needed for fever or moderate pain.   PREDNISONE (DELTASONE) 20 MG TABLET    2 tabs po daily x 4 days     Marily Memos, MD 03/04/17 0002

## 2017-11-17 ENCOUNTER — Encounter (HOSPITAL_COMMUNITY): Payer: Self-pay | Admitting: Emergency Medicine

## 2017-11-17 ENCOUNTER — Emergency Department (HOSPITAL_COMMUNITY): Payer: 59

## 2017-11-17 ENCOUNTER — Emergency Department (HOSPITAL_COMMUNITY)
Admission: EM | Admit: 2017-11-17 | Discharge: 2017-11-17 | Disposition: A | Discharge status: Home / Self Care | Payer: 59 | Attending: Emergency Medicine | Admitting: Emergency Medicine

## 2017-11-17 DIAGNOSIS — J45901 Unspecified asthma with (acute) exacerbation: Secondary | ICD-10-CM

## 2017-11-17 DIAGNOSIS — R05 Cough: Secondary | ICD-10-CM | POA: Diagnosis present

## 2017-11-17 DIAGNOSIS — F17210 Nicotine dependence, cigarettes, uncomplicated: Secondary | ICD-10-CM | POA: Insufficient documentation

## 2017-11-17 MED ORDER — IPRATROPIUM-ALBUTEROL 0.5-2.5 (3) MG/3ML IN SOLN
3.0000 mL | Freq: Once | RESPIRATORY_TRACT | Status: AC
  Administered 2017-11-17: 3 mL via RESPIRATORY_TRACT
  Filled 2017-11-17: qty 3

## 2017-11-17 MED ORDER — ALBUTEROL SULFATE HFA 108 (90 BASE) MCG/ACT IN AERS: 1.0000 | INHALATION_SPRAY | Freq: Four times a day (QID) | RESPIRATORY_TRACT | 2 refills | Status: AC | PRN

## 2017-11-17 MED ORDER — AZITHROMYCIN 250 MG PO TABS: 250.0000 mg | ORAL_TABLET | Freq: Every day | ORAL | 0 refills | Status: DC

## 2017-11-17 MED ORDER — AZITHROMYCIN 250 MG PO TABS: 250.0000 mg | ORAL_TABLET | Freq: Every day | ORAL | 0 refills | Status: AC

## 2017-11-17 MED ORDER — ALBUTEROL SULFATE HFA 108 (90 BASE) MCG/ACT IN AERS
1.0000 | INHALATION_SPRAY | Freq: Once | RESPIRATORY_TRACT | Status: AC
  Administered 2017-11-17: 1 via RESPIRATORY_TRACT
  Filled 2017-11-17: qty 6.7

## 2017-11-17 MED ORDER — BENZONATATE 100 MG PO CAPS: 200.0000 mg | ORAL_CAPSULE | Freq: Three times a day (TID) | ORAL | 0 refills | Status: DC

## 2017-11-17 MED ORDER — ALBUTEROL SULFATE HFA 108 (90 BASE) MCG/ACT IN AERS: 1.0000 | INHALATION_SPRAY | Freq: Four times a day (QID) | RESPIRATORY_TRACT | 0 refills | Status: DC | PRN

## 2017-11-17 MED ORDER — DEXAMETHASONE SODIUM PHOSPHATE 10 MG/ML IJ SOLN
10.0000 mg | Freq: Once | INTRAMUSCULAR | Status: DC
  Filled 2017-11-17: qty 1

## 2017-11-17 MED ORDER — DEXAMETHASONE SODIUM PHOSPHATE 10 MG/ML IJ SOLN
10.0000 mg | Freq: Once | INTRAMUSCULAR | Status: AC
  Administered 2017-11-17: 10 mg via INTRAMUSCULAR

## 2017-11-17 MED ORDER — ALBUTEROL SULFATE (2.5 MG/3ML) 0.083% IN NEBU
5.0000 mg | INHALATION_SOLUTION | Freq: Once | RESPIRATORY_TRACT | Status: AC
  Administered 2017-11-17: 5 mg via RESPIRATORY_TRACT
  Filled 2017-11-17: qty 6

## 2017-11-17 NOTE — ED Provider Notes (Signed)
[facility]-EMERGENCY DEPT Provider Note   CSN: [id number] Arrival date & time: 11/17/17  1716     History   Chief Complaint Chief Complaint  Patient presents with  . Asthma    HPI Carolyn Martinez is a 41 y.o. female with a past medical history of asthma, who presents to ED for evaluation of a 2-week history of productive cough and wheezing.  She ran out of her at home and was unable to take any medications to help with her symptoms.  Reports this feels similar to her previous asthma exacerbations.  Reports son with chest cold as well at home which she was exposed to.  She reports cough with clear and yellow sputum production.  She denies any fevers, chills, other URI symptoms, nausea, vomiting, chest pain, hemoptysis, prior PE or DVT.  HPI  Past Medical History:  Diagnosis Date  . Arthritis   . Asthma     There are no active problems to display for this patient.   Past Surgical History:  Procedure Laterality Date  . TONSILLECTOMY    . WISDOM TOOTH EXTRACTION      OB History    No data available       Home Medications    Prior to Admission medications   Medication Sig Start Date End Date Taking? Authorizing Provider  albuterol (PROVENTIL HFA;VENTOLIN HFA) 108 (90 Base) MCG/ACT inhaler Inhale 1-2 puffs into the lungs every 6 (six) hours as needed for wheezing or shortness of breath. 11/17/17   Ishmael Berkovich, PA-C  azithromycin (ZITHROMAX Z-PAK) 250 MG tablet Take 1 tablet (250 mg total) by mouth daily for 5 days. Take 500mg  on first day, and then 250mg  each day thereafter. 11/17/17 11/22/17  Myrle Dues, PA-C  benzonatate (TESSALON) 100 MG capsule Take 2 capsules (200 mg total) by mouth every 8 (eight) hours. 11/17/17   Rionna Feltes, PA-C  guaiFENesin (MUCINEX CHEST CONGESTION CHILD) 100 MG/5ML liquid Take 400 mg by mouth 3 (three) times daily as needed for cough.    [provider]  HYDROcodone-homatropine (HYCODAN) 5-1.5 MG/5ML syrup  Take 5 mLs by mouth every 6 (six) hours as needed for cough. 03/03/17   Mesner, Barbara CowerJason, MD  ibuprofen (ADVIL,MOTRIN) 400 MG tablet Take 1 tablet (400 mg total) by mouth every 6 (six) hours as needed for fever or moderate pain. 03/03/17   Mesner, Barbara CowerJason, MD  Phenylephrine-Pheniramine-DM [facility](THERAFLU COLD & COUGH PO) Take 1 tablet by mouth daily as needed (cough).    [provider]  predniSONE (DELTASONE) 20 MG tablet 2 tabs po daily x 4 days 03/03/17   Mesner, Barbara CowerJason, MD    Family History No family history on file.  Social History Social History   Tobacco Use  . Smoking status: Current Every Day Smoker    Packs/day: 0.25    Years: 15.00    Pack years: 3.75    Types: Cigarettes  Substance Use Topics  . Alcohol use: No  . Drug use: No     Allergies   Patient has no known allergies.   Review of Systems Review of Systems  Constitutional: Negative for chills and fever.  Respiratory: Positive for cough and wheezing. Negative for choking, chest tightness and shortness of breath.   Cardiovascular: Negative for chest pain and palpitations.  Gastrointestinal: Negative for nausea and vomiting.  Skin: Negative for rash.     Physical Exam Updated Vital Signs BP (!) 146/85 (BP Location: Left Arm)   Pulse 82   Temp  98.3 F (36.8 C) (Oral)   Resp 20   LMP 11/16/2017   SpO2 97%   Physical Exam  Constitutional: She appears well-developed and well-nourished. No distress.  Nontoxic appearing and in no acute distress.  Able to speak in complete sentences with no signs of airway compromise or respiratory distress.  HENT:  Head: Normocephalic and atraumatic.  Eyes: Conjunctivae and EOM are normal. No scleral icterus.  Neck: Normal range of motion.  Cardiovascular: Normal rate, regular rhythm and normal heart sounds.  Pulmonary/Chest: Effort normal. No respiratory distress. She has wheezes (Right-sided and expiratory wheezing noted.).  Neurological: She is alert.  Skin: No rash  noted. She is not diaphoretic.  Psychiatric: She has a normal mood and affect.  Nursing note and vitals reviewed.    ED Treatments / Results  Labs (all labs ordered are listed, but only abnormal results are displayed) Labs Reviewed - No data to display  EKG  EKG Interpretation None       Radiology Dg Chest 2 View  Result Date: 11/17/2017 CLINICAL DATA:  41 year old female with cough, congestion and dyspnea for 2 weeks. EXAM: CHEST  2 VIEW COMPARISON:  03/24/2005 FINDINGS: The heart size and mediastinal contours are within normal limits. Vague opacity along the left heart border may represent developing infiltrate versus atelectasis. There is right basilar atelectasis. No sizeable effusions or pneumothorax. The visualized skeletal structures are unremarkable. IMPRESSION: Vague opacity at the left heart border concerning for developing infiltrate versus atelectasis. Electronically Signed   By: Sande Brothers M.D.   On: 11/17/2017 18:30    Procedures Procedures (including critical care time)  Medications Ordered in ED Medications  albuterol (PROVENTIL HFA;VENTOLIN HFA) 108 (90 Base) MCG/ACT inhaler 1 puff (not administered)  dexamethasone (DECADRON) injection 10 mg (not administered)  albuterol (PROVENTIL) (2.5 MG/3ML) 0.083% nebulizer solution 5 mg (5 mg Nebulization Given 11/17/17 1732)  ipratropium-albuterol (DUONEB) 0.5-2.5 (3) MG/3ML nebulizer solution 3 mL (3 mLs Nebulization Given 11/17/17 1858)     Initial Impression / Assessment and Plan / ED Course  I have reviewed the triage vital signs and the nursing notes.  Pertinent labs & imaging results that were available during my care of the patient were reviewed by me and considered in my medical decision making (see chart for details).     Patient presents to ED for evaluation of 2-week history of productive cough and intermittent wheezing.  She does have a history of asthma and states that this feels similar to her  previous asthma exacerbations.  She ran out of her inhaler for several weeks and was unable to take any medications to help with symptoms.  She reports cough with clear and yellow sputum production.  She denies any fevers, chills, URI symptoms, vomiting, chest pain, hemoptysis, prior PE or DVT.  On physical exam patient does have some end expiratory wheezing on the right side which persisted even after her first breathing treatment.  She is overall well-appearing.  She is afebrile.  Patient states that sometimes it takes a few breathing treatments for her to feel better.  Patient given second breathing treatment, DuoNeb this time, and reports complete resolution of her symptoms.  Her chest x-ray did show possible early infiltrate versus atelectasis.  Based on her symptoms we will treat her for possible pneumonia.  She states that she does not feel like she needs to be admitted for her symptoms and can manage it with outpatient treatment.  We will give her Decadron here and inhaler as  well as refills for inhaler, Tessalon Perles and antibiotic.  Advised to take Tylenol or ibuprofen as needed for fevers.  Patient appears stable for discharge at this time.  Strict return precautions given.  Final Clinical Impressions(s) / ED Diagnoses   Final diagnoses:  Exacerbation of asthma, unspecified asthma severity, unspecified whether persistent     Dietrich PatesKhatri, Kaston Faughn, PA-C 11/17/17 Jarome [id number]    Allen, Anthony, MD 11/17/17 2023

## 2017-11-17 NOTE — Discharge Instructions (Signed)
Please read the attached information regarding your condition. Take azithromycin antibiotic as directed. Use albuterol inhaler and take Tessalon Perles as needed. Continue your other home medications as previously prescribed. [facility]. Return to ED for worsening cough, chest pain, shortness of breath, coughing up blood, wheezing.

## 2017-11-17 NOTE — ED Triage Notes (Signed)
Patient reports having cough for past 2 weeks. States worsening due to her asthma and is out of her albuterol. Lung sounds clear but diminished. Started on duoneb.

## 2018-06-14 IMAGING — CT CT ANGIO CHEST
3 of 7 series · 17 of 46 positions shown · IV contrast (ISOVUE 370)
Comparison: None.

CLINICAL DATA: Increasing shortness of breath for 2 days. Concern
for dissection and for preliminary embolus. Sore throat 3 days ago
with cough and now shortness of breath.

EXAM:
CT ANGIOGRAPHY CHEST WITH CONTRAST
TECHNIQUE: Multidetector CT imaging of the chest was performed using the
standard protocol during bolus administration of intravenous
contrast. Multiplanar CT image reconstructions and MIPs were
obtained to evaluate the vascular anatomy.
CONTRAST:  100 mL Isovue 370

[Series 6: arterial 3.0 b30f · axial · arterial · 0.73mm/px · z∈[-4,+224]mm · 10 of 94 slices shown]
[im 9/94  lung]
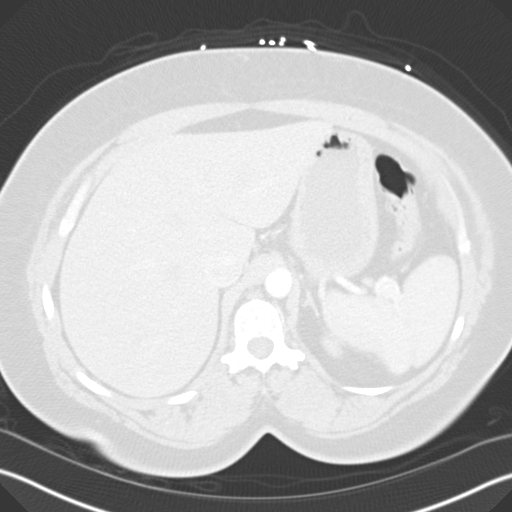
[im 17/94  soft-tissue]
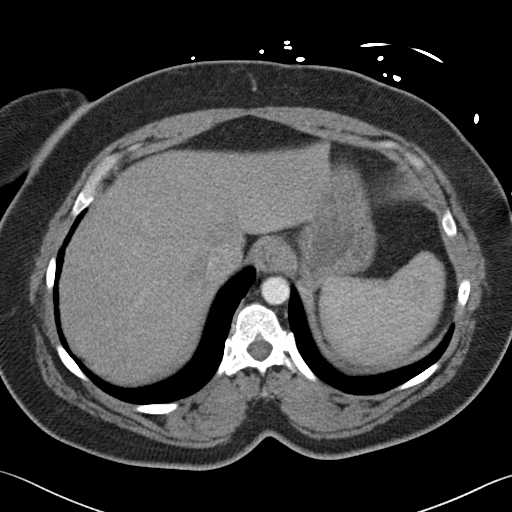
[im 26/94  lung]
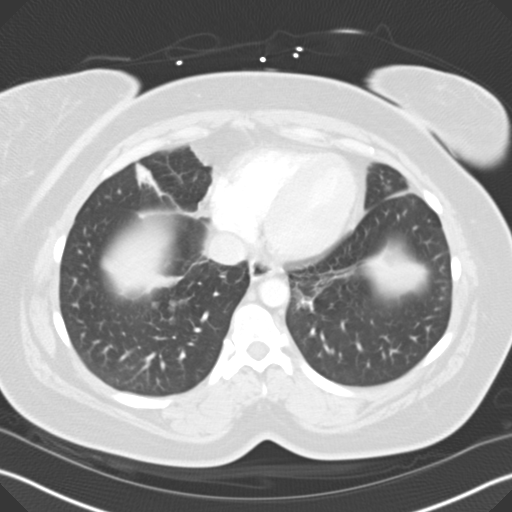
[im 34/94  soft-tissue]
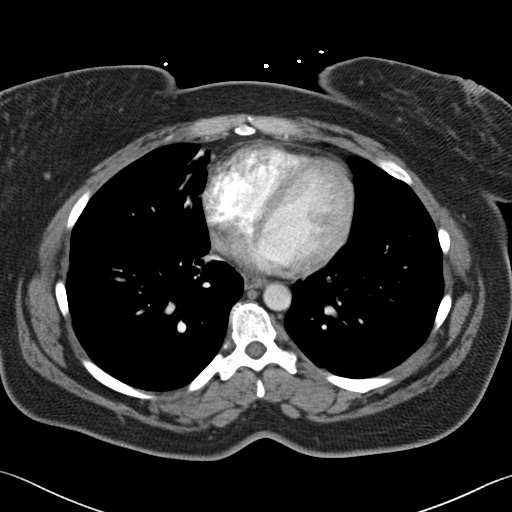
[im 43/94  lung]
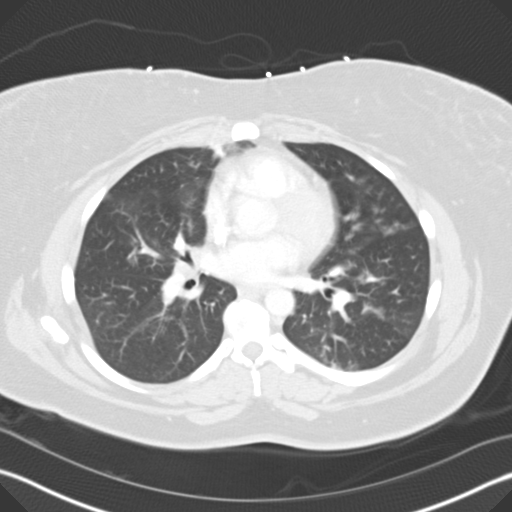
[im 51/94  soft-tissue]
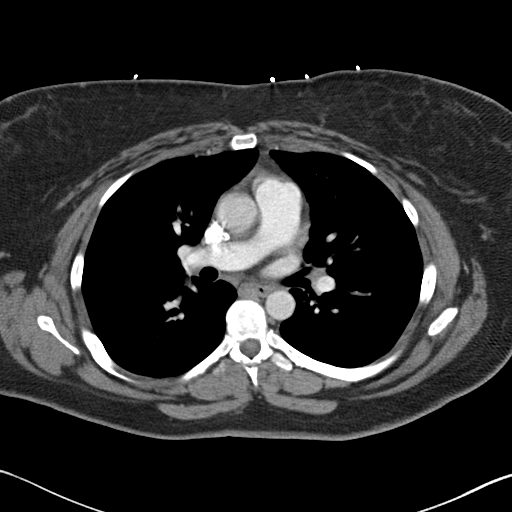
[im 60/94  lung]
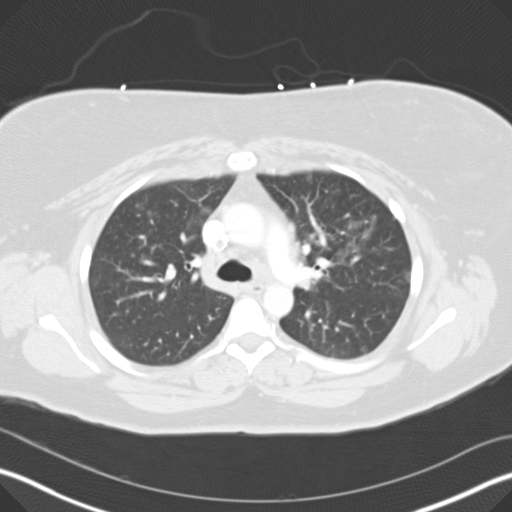
[im 68/94  soft-tissue]
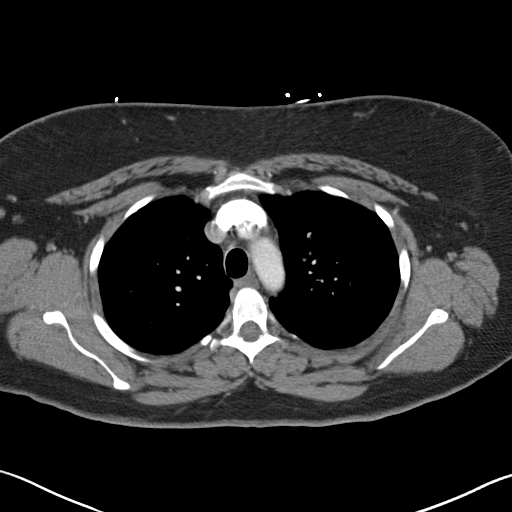
[im 77/94  lung]
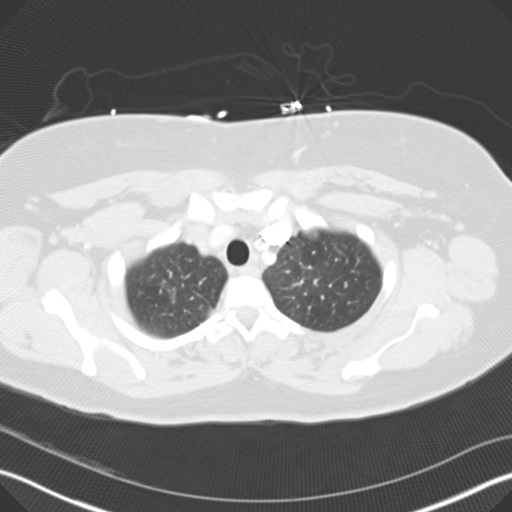
[im 85/94  soft-tissue]
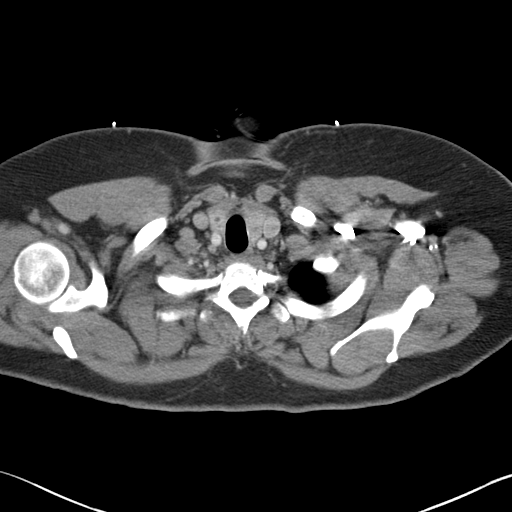

[Series 8: lung windows · axial · 0.73mm/px · z∈[-4,+98]mm · 4 of 94 slices shown]
[im 9/94  soft-tissue]
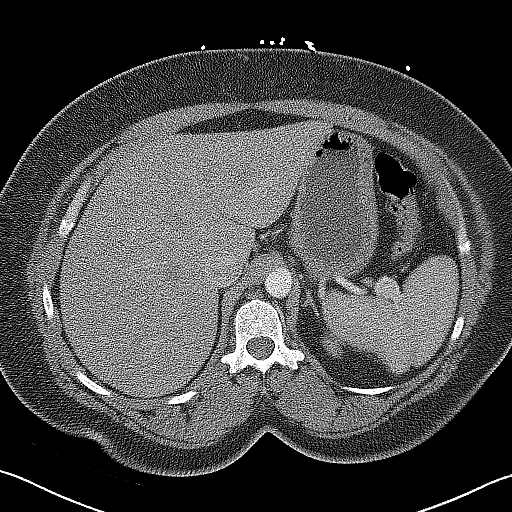
[im 17/94  soft-tissue]
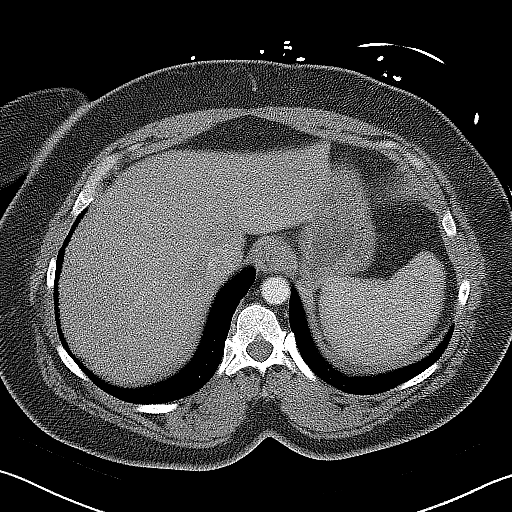
[im 34/94  soft-tissue]
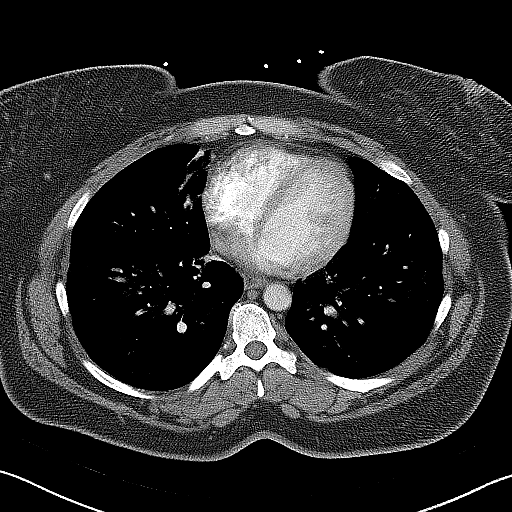
[im 43/94  soft-tissue]
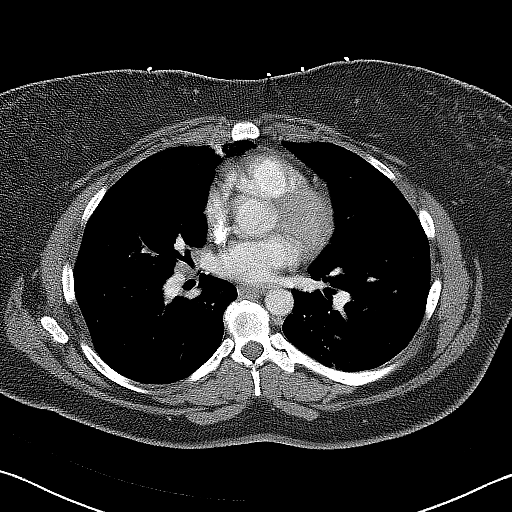

[Series 9: coronal mpr · coronal · 0.56mm/px · 3 of 134 slices shown]
[im 34/134  soft-tissue]
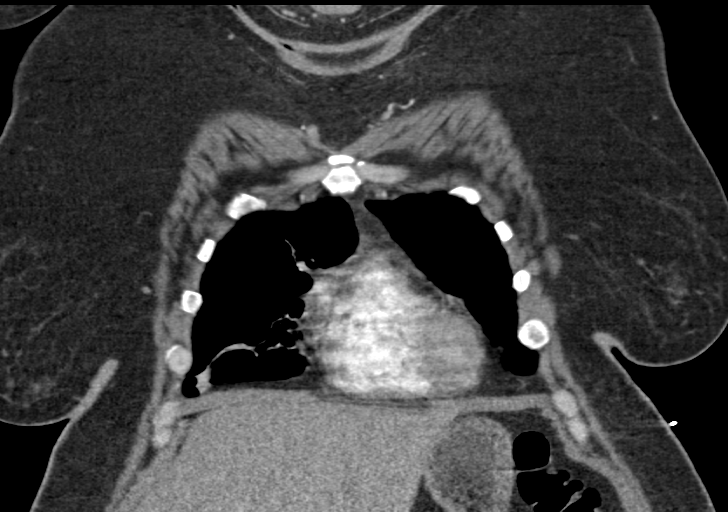
[im 67/134  soft-tissue]
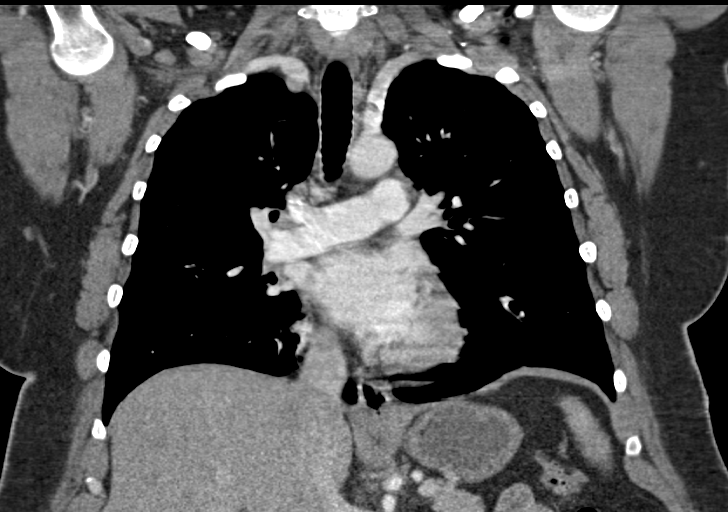
[im 100/134  soft-tissue]
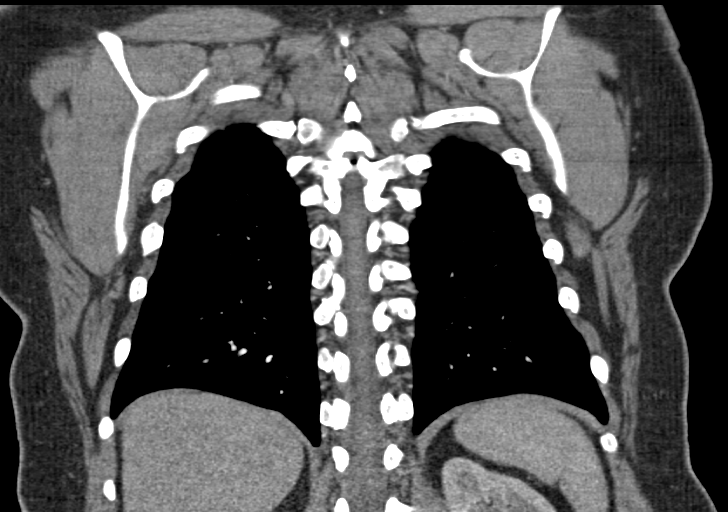

[17 of 46 positions shown; findings below may reference images not displayed]

FINDINGS: Cardiovascular: Noncontrast images of the chest demonstrate no
significant aortic atherosclerosis. No evidence of intramural
hematoma. Images obtained during the early arterial phase after
administration of intravenous contrast material demonstrate
moderately good opacification of the central pulmonary arteries
without evidence of significant pulmonary embolus. There is
moderately good opacification of the thoracic aorta. No direct
evidence of aortic dissection. No aortic aneurysm. Great vessel
origins are patent. Normal heart size. No pericardial effusion.

Mediastinum/Nodes: No enlarged mediastinal, hilar, or axillary lymph
nodes. Thyroid gland, trachea, and esophagus demonstrate no
significant findings.

Lungs/Pleura: Evaluation of the lungs is significantly limited due
to motion artifact. There are patchy changes of nodular airspace
disease demonstrated throughout both lungs in a predominantly
central distribution. This is nonspecific but likely to represent
multifocal pneumonia. Other possibilities would include septic
emboli, atypical fungal infection or TB, atypical appearance of
early edema, or possibly neoplastic process. No pleural effusions.
No pneumothorax. Airways are patent.

Upper Abdomen: No acute abnormality.

Musculoskeletal: Mild degenerative changes in the thoracic spine.

Review of the MIP images confirms the above findings.
IMPRESSION: No evidence of pulmonary embolus or area dissection. Diffuse patchy
nodular airspace infiltration throughout both lungs is likely due to
multifocal pneumonia but the appearance is atypical. Suggest
follow-up in 3 months after appropriate clinical treatment to
confirm resolution.

## 2018-08-16 ENCOUNTER — Other Ambulatory Visit: Payer: Self-pay

## 2018-08-16 ENCOUNTER — Encounter (HOSPITAL_COMMUNITY): Payer: Self-pay

## 2018-08-16 DIAGNOSIS — J4521 Mild intermittent asthma with (acute) exacerbation: Secondary | ICD-10-CM | POA: Insufficient documentation

## 2018-08-16 DIAGNOSIS — F17210 Nicotine dependence, cigarettes, uncomplicated: Secondary | ICD-10-CM | POA: Insufficient documentation

## 2018-08-16 DIAGNOSIS — Z79899 Other long term (current) drug therapy: Secondary | ICD-10-CM | POA: Insufficient documentation

## 2018-08-16 MED ORDER — ALBUTEROL SULFATE (2.5 MG/3ML) 0.083% IN NEBU
5.0000 mg | INHALATION_SOLUTION | Freq: Once | RESPIRATORY_TRACT | Status: AC
  Administered 2018-08-17: 5 mg via RESPIRATORY_TRACT
  Filled 2018-08-16: qty 6

## 2018-08-16 MED ORDER — ALBUTEROL SULFATE (2.5 MG/3ML) 0.083% IN NEBU
INHALATION_SOLUTION | RESPIRATORY_TRACT | Status: AC
  Administered 2018-08-17: 5 mg via RESPIRATORY_TRACT
  Filled 2018-08-16: qty 6

## 2018-08-16 NOTE — ED Triage Notes (Addendum)
Pt reports asthma attack, but states that she has been feeling "tighter" for about a week. She states that she is out of her home medication.

## 2018-08-17 ENCOUNTER — Emergency Department (HOSPITAL_COMMUNITY)
Admission: EM | Admit: 2018-08-17 | Discharge: 2018-08-17 | Disposition: A | Discharge status: Home / Self Care | Payer: 59 | Attending: Emergency Medicine | Admitting: Emergency Medicine

## 2018-08-17 DIAGNOSIS — J4521 Mild intermittent asthma with (acute) exacerbation: Secondary | ICD-10-CM

## 2018-08-17 MED ORDER — ALBUTEROL SULFATE HFA 108 (90 BASE) MCG/ACT IN AERS
2.0000 | INHALATION_SPRAY | RESPIRATORY_TRACT | Status: DC | PRN
  Administered 2018-08-17: 2 via RESPIRATORY_TRACT
  Filled 2018-08-17: qty 6.7

## 2018-08-17 MED ORDER — DEXAMETHASONE 4 MG PO TABS
10.0000 mg | ORAL_TABLET | Freq: Once | ORAL | Status: AC
  Administered 2018-08-17: 10 mg via ORAL
  Filled 2018-08-17: qty 2

## 2018-08-17 NOTE — ED Provider Notes (Signed)
[facility]-EMERGENCY DEPT Provider Note   CSN: [id number] Arrival date & time: 08/16/18  2348     History   Chief Complaint Chief Complaint  Patient presents with  . Asthma    HPI Carolyn Martinez is a 42 y.o. female.  The history is provided by the patient.  She has a history of asthma, and had an asthma attack this evening.  She had run out of her albuterol inhaler.  Unfortunately, she had also lost her health insurance and was not able to see her primary care provider to get a refill.  She denies fever, chills, cough, chest pain.  She had an albuterol nebulizer treatment in the ED prior to seeing me, and she feels like she is back to baseline.  Of note, she does still smoke cigarettes.  Past Medical History:  Diagnosis Date  . Arthritis   . Asthma     There are no active problems to display for this patient.   Past Surgical History:  Procedure Laterality Date  . TONSILLECTOMY    . WISDOM TOOTH EXTRACTION       OB History   None      Home Medications    Prior to Admission medications   Medication Sig Start Date End Date Taking? Authorizing Provider  albuterol (PROVENTIL HFA;VENTOLIN HFA) 108 (90 Base) MCG/ACT inhaler Inhale 1-2 puffs into the lungs every 6 (six) hours as needed for wheezing or shortness of breath. 11/17/17   Khatri, Hina, PA-C  benzonatate (TESSALON) 100 MG capsule Take 2 capsules (200 mg total) by mouth every 8 (eight) hours. 11/17/17   Khatri, Hina, PA-C  guaiFENesin (MUCINEX CHEST CONGESTION CHILD) 100 MG/5ML liquid Take 400 mg by mouth 3 (three) times daily as needed for cough.    [provider]  HYDROcodone-homatropine (HYCODAN) 5-1.5 MG/5ML syrup Take 5 mLs by mouth every 6 (six) hours as needed for cough. 03/03/17   Mesner, Barbara Cower, MD  ibuprofen (ADVIL,MOTRIN) 400 MG tablet Take 1 tablet (400 mg total) by mouth every 6 (six) hours as needed for fever or moderate pain. 03/03/17   Mesner, Barbara Cower, MD    Phenylephrine-Pheniramine-DM [facility] COLD & COUGH PO) Take 1 tablet by mouth daily as needed (cough).    [provider]  predniSONE (DELTASONE) 20 MG tablet 2 tabs po daily x 4 days 03/03/17   Mesner, Barbara Cower, MD    Family History History reviewed. No pertinent family history.  Social History Social History   Tobacco Use  . Smoking status: Current Every Day Smoker    Packs/day: 0.25    Years: 15.00    Pack years: 3.75    Types: Cigarettes  Substance Use Topics  . Alcohol use: No  . Drug use: No     Allergies   Patient has no known allergies.   Review of Systems Review of Systems  All other systems reviewed and are negative.    Physical Exam Updated Vital Signs BP (!) 147/95 (BP Location: Right Arm)   Pulse 66   Temp 98 F (36.7 C) (Oral)   Resp 16   Ht 5\' 2"  (1.575 m)   Wt 90.7 kg   SpO2 100%   BMI 36.58 kg/m   Physical Exam  Nursing note and vitals reviewed.  42 year old female, resting comfortably and in no acute distress. Vital signs are significant for mildly elevated blood pressure. Oxygen saturation is 100%, which is normal. Head is normocephalic and atraumatic. PERRLA, EOMI. Oropharynx is clear.  Neck is nontender and supple without adenopathy or JVD. Back is nontender and there is no CVA tenderness. Lungs are clear without rales, wheezes, or rhonchi. Chest is nontender. Heart has regular rate and rhythm without murmur. Abdomen is soft, flat, nontender without masses or hepatosplenomegaly and peristalsis is normoactive. Extremities have no cyanosis or edema, full range of motion is present. Skin is warm and dry without rash. Neurologic: Mental status is normal, cranial nerves are intact, there are no motor or sensory deficits.  ED Treatments / Results   Procedures Procedures   Medications Ordered in ED Medications  dexamethasone (DECADRON) tablet 10 mg (has no administration in time range)  albuterol (PROVENTIL HFA;VENTOLIN HFA) 108  (90 Base) MCG/ACT inhaler 2 puff (has no administration in time range)  albuterol (PROVENTIL) (2.5 MG/3ML) 0.083% nebulizer solution 5 mg (5 mg Nebulization Given 08/17/18 0048)     Initial Impression / Assessment and Plan / ED Course  I have reviewed the triage vital signs and the nursing notes.  Asthma exacerbation.  Patient tells me that her tach tonight is one that normally she would be able to control with her inhaler at home.  She is discharged with an albuterol inhaler, given dose of dexamethasone prior to discharge.  Encouraged to stop smoking.  Also, given financial resource guide to try to help her find a primary care provider who can work with her in spite of lack of health insurance.  Final Clinical Impressions(s) / ED Diagnoses   Final diagnoses:  Mild intermittent asthma with exacerbation    ED Discharge Orders    None       Dione BoozeGlick, Makya Yurko, MD 08/17/18 0202

## 2019-02-28 IMAGING — CR DG CHEST 2V
2 series · 2 of 2 positions shown · non-contrast
Comparison: 03/24/2005

CLINICAL DATA: 41-year-old female with cough, congestion and
dyspnea for 2 weeks.

EXAM:
CHEST  2 VIEW

[w chest pa]
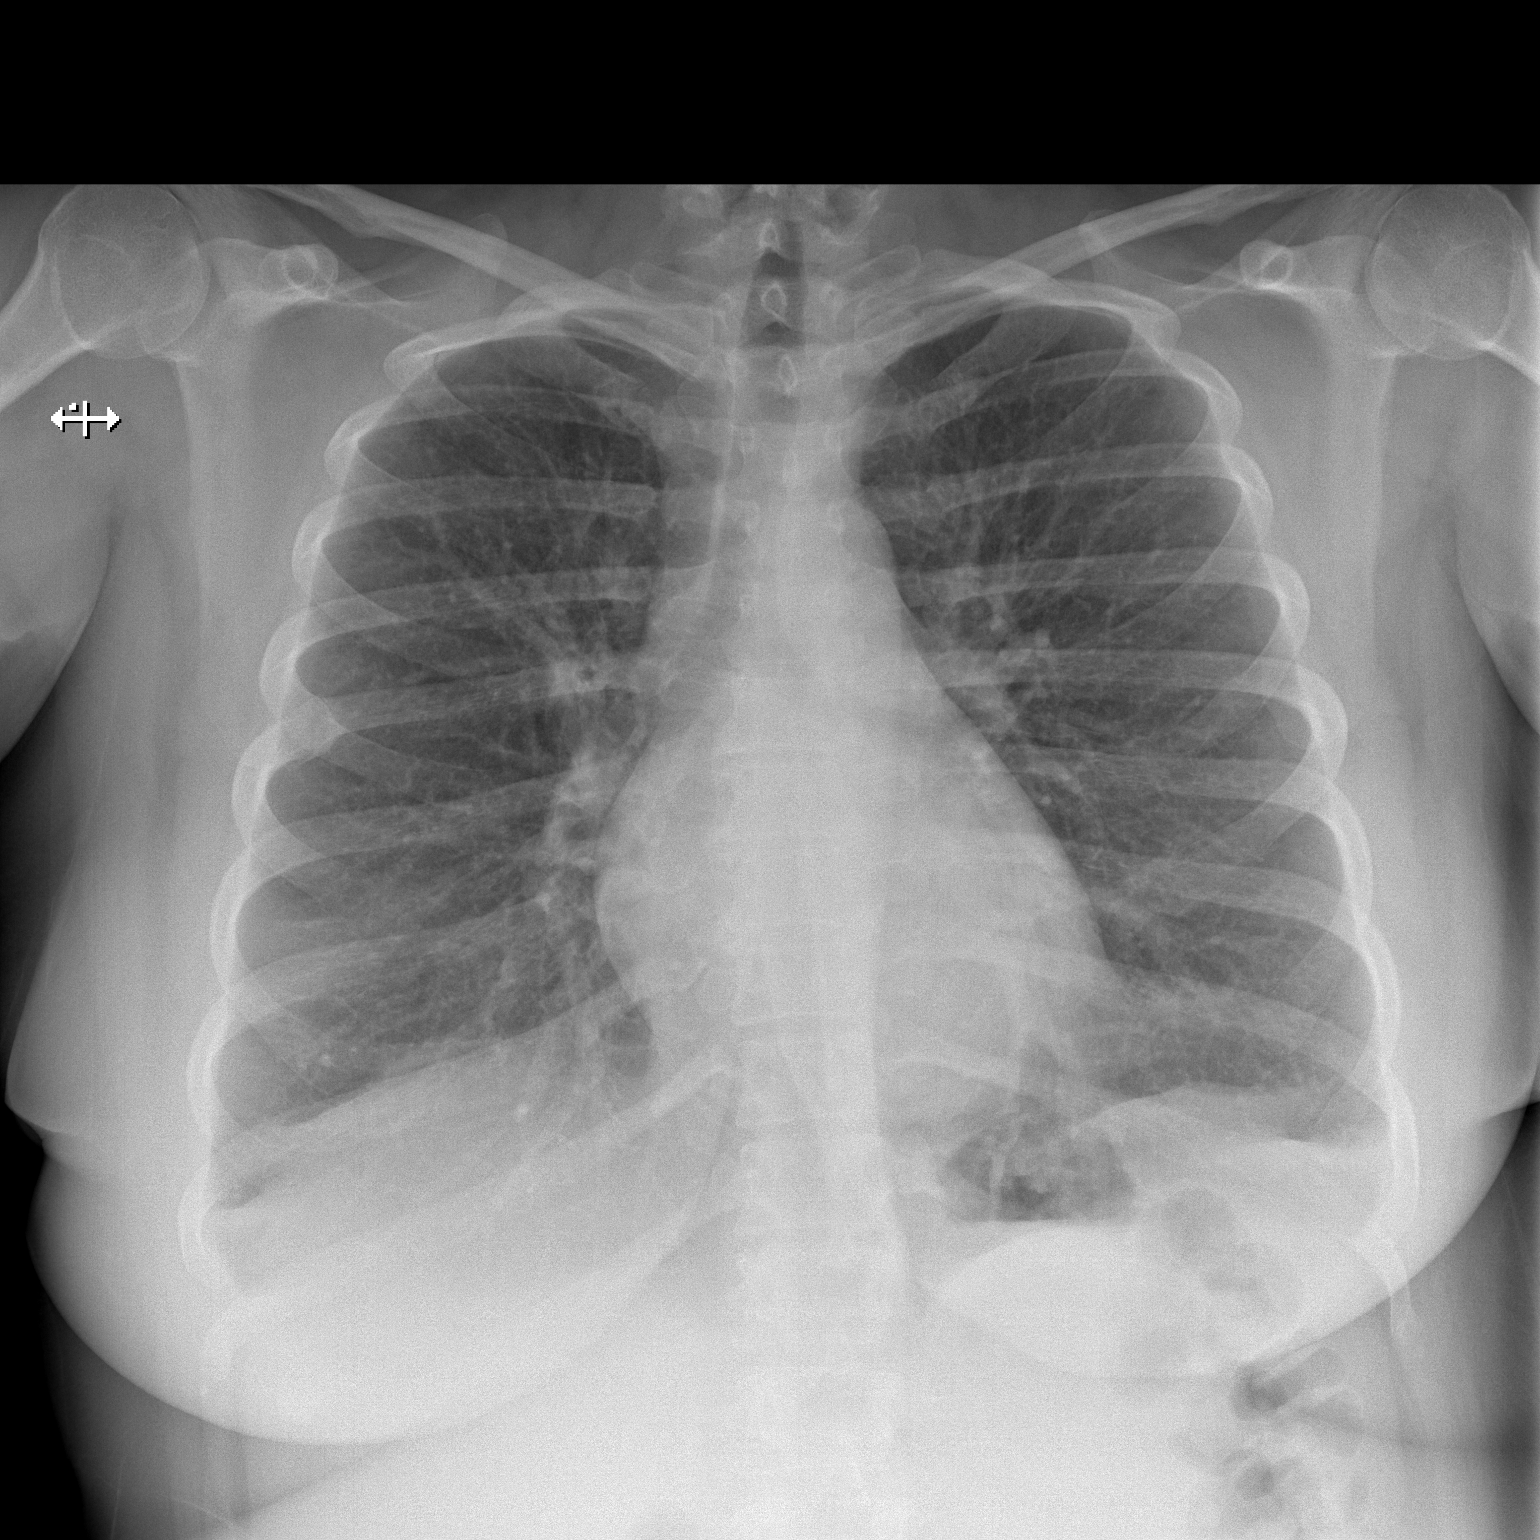

[w chest lat]
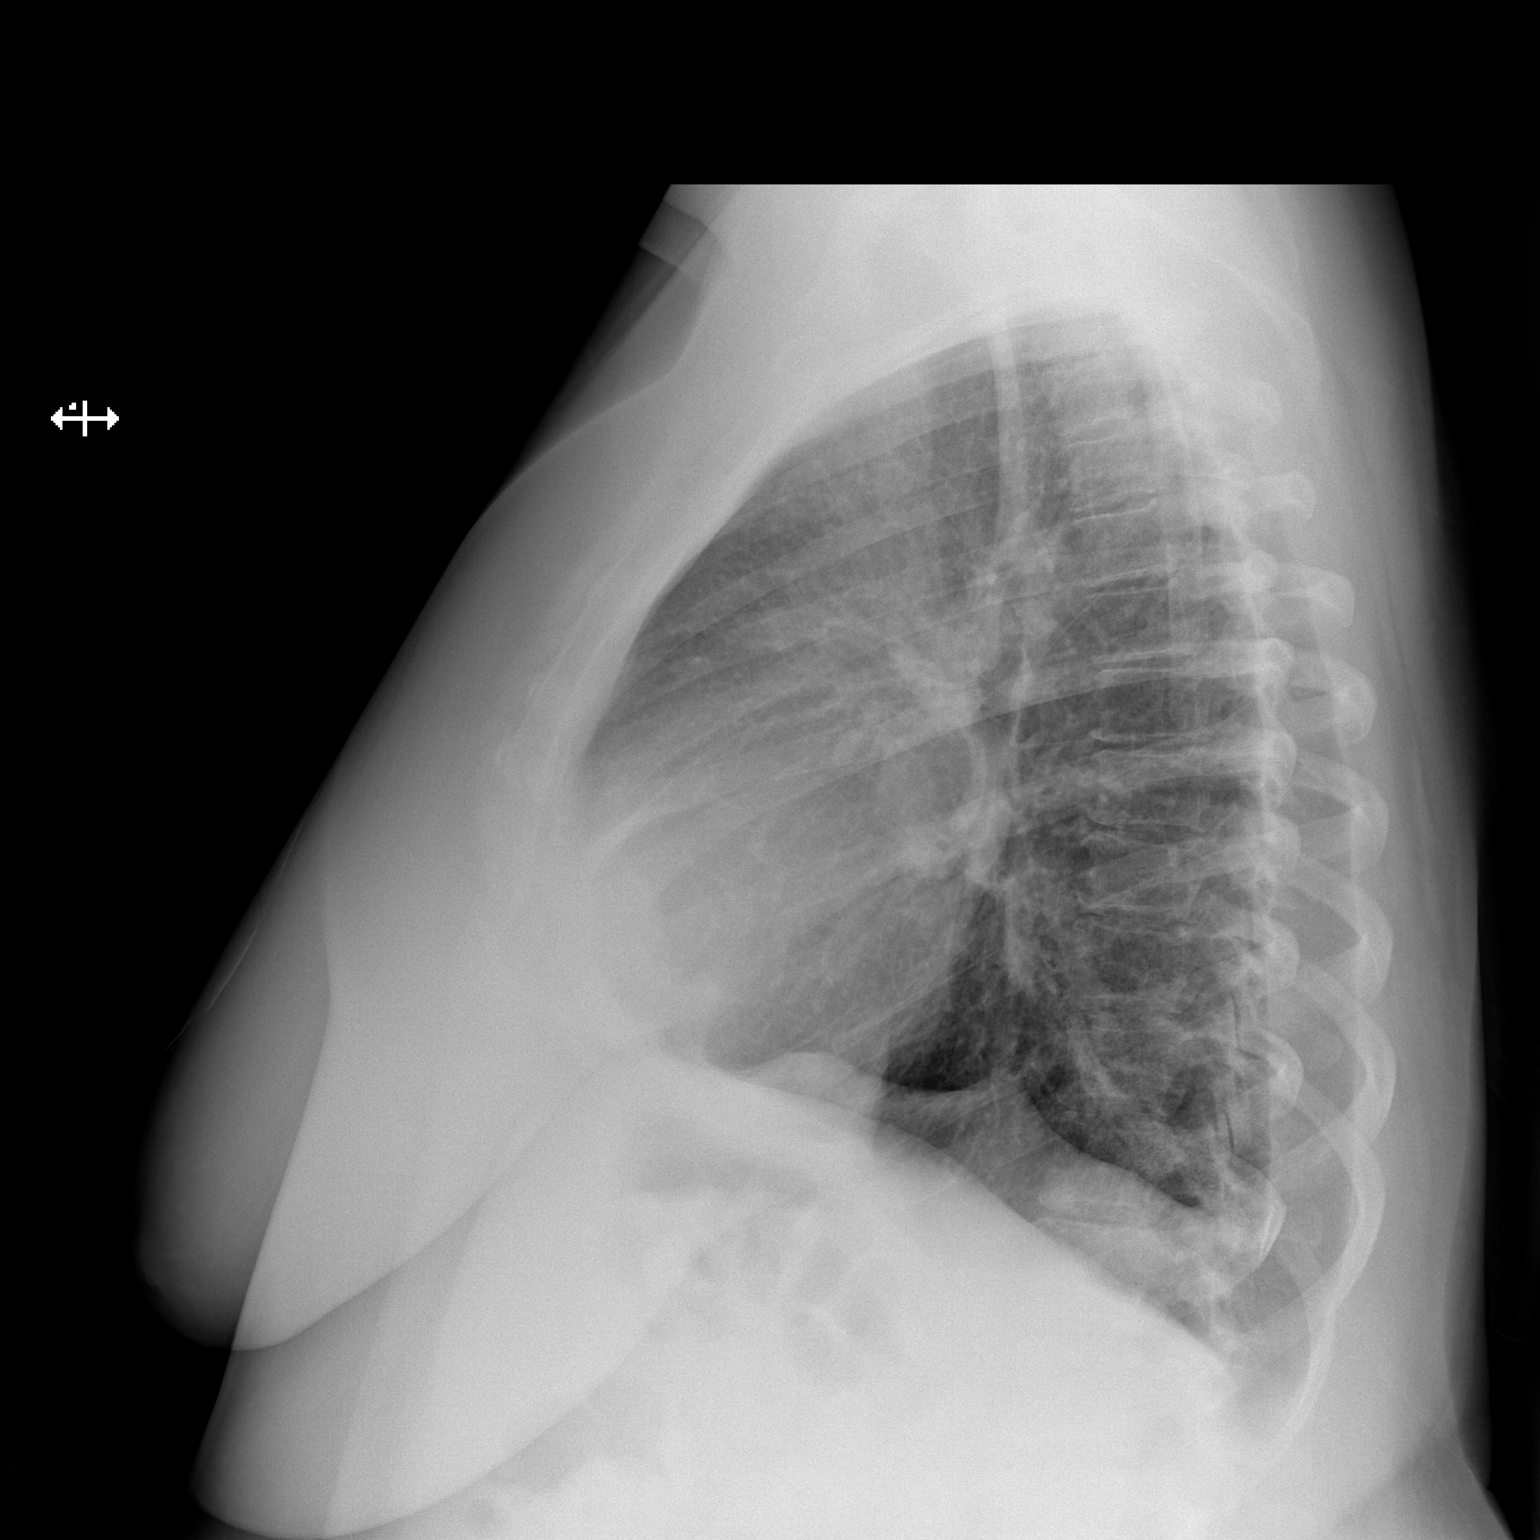

[2 of 2 positions shown; findings below may reference images not displayed]

FINDINGS: The heart size and mediastinal contours are within normal limits.

Vague opacity along the left heart border may represent developing
infiltrate versus atelectasis. There is right basilar atelectasis.
No sizeable effusions or pneumothorax.

The visualized skeletal structures are unremarkable.
IMPRESSION: Vague opacity at the left heart border concerning for developing
infiltrate versus atelectasis.
